# Patient Record
Sex: Female | Born: 1980 | Hispanic: No | Marital: Married | Smoking: Never smoker
Health system: Southern US, Community
[De-identification: ages and names within clinical notes are randomized; demographics above are authoritative.]

## PROBLEM LIST (undated history)

## (undated) DIAGNOSIS — O09529 Supervision of elderly multigravida, unspecified trimester: Secondary | ICD-10-CM

## (undated) DIAGNOSIS — E119 Type 2 diabetes mellitus without complications: Secondary | ICD-10-CM

## (undated) DIAGNOSIS — E039 Hypothyroidism, unspecified: Secondary | ICD-10-CM

## (undated) HISTORY — DX: Supervision of elderly multigravida, unspecified trimester: O09.529

## (undated) HISTORY — DX: Type 2 diabetes mellitus without complications: E11.9

## (undated) HISTORY — PX: NO PAST SURGERIES: SHX2092

## (undated) HISTORY — DX: Hypothyroidism, unspecified: E03.9

---

## 2014-07-08 NOTE — L&D Delivery Note (Signed)
Delivery Note Patient was admitted in active labor. She labor down. AROM at 9AM.  At  At 9:21 AM a viable female was delivered via Vaginal, Spontaneous Delivery (Presentation: Right Occiput Anterior).  APGAR: 8, 9; weight 7 lb 12.7 oz (3535 g).  Patient has postpartum bleeding with retained productions of conception. Manual extraction of placenta and blood clot was performed and bleeding remitted. Patient blood loss was significant for postpartum hemorrhage.  Placenta status: Intact, Spontaneous.  Cord: 3 vessels with the following complications: None.  Cord pH: none collected   Anesthesia: Epidural  Episiotomy: None Lacerations: 2nd degree Suture Repair: 3.0 vicryl Est. Blood Loss (mL): 1200  Mom to postpartum.  Baby to Couplet care / Skin to Skin.  Octavia Velador Z Shai Rasmussen 06/05/2015, 11:15 AM

## 2014-11-03 LAB — OB RESULTS CONSOLE RPR: RPR: NONREACTIVE

## 2014-11-03 LAB — OB RESULTS CONSOLE ABO/RH: RH Type: POSITIVE

## 2014-11-03 LAB — OB RESULTS CONSOLE RUBELLA ANTIBODY, IGM: Rubella: IMMUNE

## 2014-11-03 LAB — OB RESULTS CONSOLE HGB/HCT, BLOOD
HCT: 37 %
Hemoglobin: 12.1 g/dL

## 2014-11-03 LAB — OB RESULTS CONSOLE PLATELET COUNT: PLATELETS: 227 10*3/uL

## 2015-05-01 LAB — OB RESULTS CONSOLE GBS: STREP GROUP B AG: NEGATIVE

## 2015-05-10 ENCOUNTER — Encounter: Payer: Self-pay | Admitting: *Deleted

## 2015-05-10 DIAGNOSIS — O3110X0 Continuing pregnancy after spontaneous abortion of one fetus or more, unspecified trimester, not applicable or unspecified: Secondary | ICD-10-CM

## 2015-05-10 DIAGNOSIS — O0990 Supervision of high risk pregnancy, unspecified, unspecified trimester: Secondary | ICD-10-CM | POA: Insufficient documentation

## 2015-05-10 DIAGNOSIS — E559 Vitamin D deficiency, unspecified: Secondary | ICD-10-CM

## 2015-05-22 ENCOUNTER — Encounter: Payer: Self-pay | Admitting: Obstetrics and Gynecology

## 2015-05-22 ENCOUNTER — Ambulatory Visit (INDEPENDENT_AMBULATORY_CARE_PROVIDER_SITE_OTHER): Payer: Self-pay | Admitting: Obstetrics and Gynecology

## 2015-05-22 VITALS — BP 111/59 | HR 86 | Wt 226.9 lb

## 2015-05-22 DIAGNOSIS — Z113 Encounter for screening for infections with a predominantly sexual mode of transmission: Secondary | ICD-10-CM

## 2015-05-22 DIAGNOSIS — O0940 Supervision of pregnancy with grand multiparity, unspecified trimester: Secondary | ICD-10-CM | POA: Insufficient documentation

## 2015-05-22 DIAGNOSIS — O0943 Supervision of pregnancy with grand multiparity, third trimester: Secondary | ICD-10-CM

## 2015-05-22 DIAGNOSIS — O0993 Supervision of high risk pregnancy, unspecified, third trimester: Secondary | ICD-10-CM

## 2015-05-22 DIAGNOSIS — O09529 Supervision of elderly multigravida, unspecified trimester: Secondary | ICD-10-CM | POA: Insufficient documentation

## 2015-05-22 DIAGNOSIS — O09523 Supervision of elderly multigravida, third trimester: Secondary | ICD-10-CM

## 2015-05-22 LAB — POCT URINALYSIS DIP (DEVICE)
BILIRUBIN URINE: NEGATIVE
Glucose, UA: NEGATIVE mg/dL
HGB URINE DIPSTICK: NEGATIVE
KETONES UR: NEGATIVE mg/dL
LEUKOCYTES UA: NEGATIVE
Nitrite: NEGATIVE
Protein, ur: NEGATIVE mg/dL
SPECIFIC GRAVITY, URINE: 1.025 (ref 1.005–1.030)
Urobilinogen, UA: 0.2 mg/dL (ref 0.0–1.0)
pH: 6 (ref 5.0–8.0)

## 2015-05-22 LAB — CBC
HCT: 38.9 % (ref 36.0–46.0)
Hemoglobin: 12.9 g/dL (ref 12.0–15.0)
MCH: 28.1 pg (ref 26.0–34.0)
MCHC: 33.2 g/dL (ref 30.0–36.0)
MCV: 84.7 fL (ref 78.0–100.0)
MPV: 11.1 fL (ref 8.6–12.4)
PLATELETS: 214 10*3/uL (ref 150–400)
RBC: 4.59 MIL/uL (ref 3.87–5.11)
RDW: 16.7 % — ABNORMAL HIGH (ref 11.5–15.5)
WBC: 7.2 10*3/uL (ref 4.0–10.5)

## 2015-05-22 LAB — RPR

## 2015-05-22 LAB — HIV ANTIBODY (ROUTINE TESTING W REFLEX): HIV: NONREACTIVE

## 2015-05-22 NOTE — Patient Instructions (Signed)
Third Trimester of Pregnancy The third trimester is from week 29 through week 42, months 7 through 9. The third trimester is a time when the fetus is growing rapidly. At the end of the ninth month, the fetus is about 20 inches in length and weighs 6-10 pounds.  BODY CHANGES Your body goes through many changes during pregnancy. The changes vary from woman to woman.   Your weight will continue to increase. You can expect to gain 25-35 pounds (11-16 kg) by the end of the pregnancy.  You may begin to get stretch marks on your hips, abdomen, and breasts.  You may urinate more often because the fetus is moving lower into your pelvis and pressing on your bladder.  You may develop or continue to have heartburn as a result of your pregnancy.  You may develop constipation because certain hormones are causing the muscles that push waste through your intestines to slow down.  You may develop hemorrhoids or swollen, bulging veins (varicose veins).  You may have pelvic pain because of the weight gain and pregnancy hormones relaxing your joints between the bones in your pelvis. Backaches may result from overexertion of the muscles supporting your posture.  You may have changes in your hair. These can include thickening of your hair, rapid growth, and changes in texture. Some women also have hair loss during or after pregnancy, or hair that feels dry or thin. Your hair will most likely return to normal after your baby is born.  Your breasts will continue to grow and be tender. A yellow discharge may leak from your breasts called colostrum.  Your belly button may stick out.  You may feel short of breath because of your expanding uterus.  You may notice the fetus "dropping," or moving lower in your abdomen.  You may have a bloody mucus discharge. This usually occurs a few days to a week before labor begins.  Your cervix becomes thin and soft (effaced) near your due date. WHAT TO EXPECT AT YOUR  PRENATAL EXAMS  You will have prenatal exams every 2 weeks until week 36. Then, you will have weekly prenatal exams. During a routine prenatal visit:  You will be weighed to make sure you and the fetus are growing normally.  Your blood pressure is taken.  Your abdomen will be measured to track your baby's growth.  The fetal heartbeat will be listened to.  Any test results from the previous visit will be discussed.  You may have a cervical check near your due date to see if you have effaced. At around 36 weeks, your caregiver will check your cervix. At the same time, your caregiver will also perform a test on the secretions of the vaginal tissue. This test is to determine if a type of bacteria, Group B streptococcus, is present. Your caregiver will explain this further. Your caregiver may ask you:  What your birth plan is.  How you are feeling.  If you are feeling the baby move.  If you have had any abnormal symptoms, such as leaking fluid, bleeding, severe headaches, or abdominal cramping.  If you are using any tobacco products, including cigarettes, chewing tobacco, and electronic cigarettes.  If you have any questions. Other tests or screenings that may be performed during your third trimester include:  Blood tests that check for low iron levels (anemia).  Fetal testing to check the health, activity level, and growth of the fetus. Testing is done if you have certain medical conditions or if   there are problems during the pregnancy.  HIV (human immunodeficiency virus) testing. If you are at high risk, you may be screened for HIV during your third trimester of pregnancy. FALSE LABOR You may feel small, irregular contractions that eventually go away. These are called Braxton Hicks contractions, or false labor. Contractions may last for hours, days, or even weeks before true labor sets in. If contractions come at regular intervals, intensify, or become painful, it is best to be seen  by your caregiver.  SIGNS OF LABOR   Menstrual-like cramps.  Contractions that are 5 minutes apart or less.  Contractions that start on the top of the uterus and spread down to the lower abdomen and back.  A sense of increased pelvic pressure or back pain.  A watery or bloody mucus discharge that comes from the vagina. If you have any of these signs before the 37th week of pregnancy, call your caregiver right away. You need to go to the hospital to get checked immediately. HOME CARE INSTRUCTIONS   Avoid all smoking, herbs, alcohol, and unprescribed drugs. These chemicals affect the formation and growth of the baby.  Do not use any tobacco products, including cigarettes, chewing tobacco, and electronic cigarettes. If you need help quitting, ask your health care provider. You may receive counseling support and other resources to help you quit.  Follow your caregiver's instructions regarding medicine use. There are medicines that are either safe or unsafe to take during pregnancy.  Exercise only as directed by your caregiver. Experiencing uterine cramps is a good sign to stop exercising.  Continue to eat regular, healthy meals.  Wear a good support bra for breast tenderness.  Do not use hot tubs, steam rooms, or saunas.  Wear your seat belt at all times when driving.  Avoid raw meat, uncooked cheese, cat litter boxes, and soil used by cats. These carry germs that can cause birth defects in the baby.  Take your prenatal vitamins.  Take 1500-2000 mg of calcium daily starting at the 20th week of pregnancy until you deliver your baby.  Try taking a stool softener (if your caregiver approves) if you develop constipation. Eat more high-fiber foods, such as fresh vegetables or fruit and whole grains. Drink plenty of fluids to keep your urine clear or pale yellow.  Take warm sitz baths to soothe any pain or discomfort caused by hemorrhoids. Use hemorrhoid cream if your caregiver  approves.  If you develop varicose veins, wear support hose. Elevate your feet for 15 minutes, 3-4 times a day. Limit salt in your diet.  Avoid heavy lifting, wear low heal shoes, and practice good posture.  Rest a lot with your legs elevated if you have leg cramps or low back pain.  Visit your dentist if you have not gone during your pregnancy. Use a soft toothbrush to brush your teeth and be gentle when you floss.  A sexual relationship may be continued unless your caregiver directs you otherwise.  Do not travel far distances unless it is absolutely necessary and only with the approval of your caregiver.  Take prenatal classes to understand, practice, and ask questions about the labor and delivery.  Make a trial run to the hospital.  Pack your hospital bag.  Prepare the baby's nursery.  Continue to go to all your prenatal visits as directed by your caregiver. SEEK MEDICAL CARE IF:  You are unsure if you are in labor or if your water has broken.  You have dizziness.  You have   mild pelvic cramps, pelvic pressure, or nagging pain in your abdominal area.  You have persistent nausea, vomiting, or diarrhea.  You have a bad smelling vaginal discharge.  You have pain with urination. SEEK IMMEDIATE MEDICAL CARE IF:   You have a fever.  You are leaking fluid from your vagina.  You have spotting or bleeding from your vagina.  You have severe abdominal cramping or pain.  You have rapid weight loss or gain.  You have shortness of breath with chest pain.  You notice sudden or extreme swelling of your face, hands, ankles, feet, or legs.  You have not felt your baby move in over an hour.  You have severe headaches that do not go away with medicine.  You have vision changes.   This information is not intended to replace advice given to you by your health care provider. Make sure you discuss any questions you have with your health care provider.   Document Released:  06/18/2001 Document Revised: 07/15/2014 Document Reviewed: 08/25/2012 Elsevier Interactive Patient Education 2016 [employer]  Contraception Choices Contraception (birth control) is the use of any methods or devices to prevent pregnancy. Below are some methods to help avoid pregnancy. HORMONAL METHODS   Contraceptive implant. This is a thin, plastic tube containing progesterone hormone. It does not contain estrogen hormone. Your health care provider inserts the tube in the inner part of the upper arm. The tube can remain in place for up to 3 years. After 3 years, the implant must be removed. The implant prevents the ovaries from releasing an egg (ovulation), thickens the cervical mucus to prevent sperm from entering the uterus, and thins the lining of the inside of the uterus.  Progesterone-only injections. These injections are given every 3 months by your health care provider to prevent pregnancy. This synthetic progesterone hormone stops the ovaries from releasing eggs. It also thickens cervical mucus and changes the uterine lining. This makes it harder for sperm to survive in the uterus.  Birth control pills. These pills contain estrogen and progesterone hormone. They work by preventing the ovaries from releasing eggs (ovulation). They also cause the cervical mucus to thicken, preventing the sperm from entering the uterus. Birth control pills are prescribed by a health care provider.Birth control pills can also be used to treat heavy periods.  Minipill. This type of birth control pill contains only the progesterone hormone. They are taken every day of each month and must be prescribed by your health care provider.  Birth control patch. The patch contains hormones similar to those in birth control pills. It must be changed once a week and is prescribed by a health care provider.  Vaginal ring. The ring contains hormones similar to those in birth control pills. It is left in the vagina for 3  weeks, removed for 1 week, and then a new one is put back in place. The patient must be comfortable inserting and removing the ring from the vagina.A health care provider's prescription is necessary.  Emergency contraception. Emergency contraceptives prevent pregnancy after unprotected sexual intercourse. This pill can be taken right after sex or up to 5 days after unprotected sex. It is most effective the sooner you take the pills after having sexual intercourse. Most emergency contraceptive pills are available without a prescription. Check with your pharmacist. Do not use emergency contraception as your only form of birth control. BARRIER METHODS   Female condom. This is a thin sheath (latex or rubber) that is worn over the penis   during sexual intercourse. It can be used with spermicide to increase effectiveness.  Female condom. This is a soft, loose-fitting sheath that is put into the vagina before sexual intercourse.  Diaphragm. This is a soft, latex, dome-shaped barrier that must be fitted by a health care provider. It is inserted into the vagina, along with a spermicidal jelly. It is inserted before intercourse. The diaphragm should be left in the vagina for 6 to 8 hours after intercourse.  Cervical cap. This is a round, soft, latex or plastic cup that fits over the cervix and must be fitted by a health care provider. The cap can be left in place for up to 48 hours after intercourse.  Sponge. This is a soft, circular piece of polyurethane foam. The sponge has spermicide in it. It is inserted into the vagina after wetting it and before sexual intercourse.  Spermicides. These are chemicals that kill or block sperm from entering the cervix and uterus. They come in the form of creams, jellies, suppositories, foam, or tablets. They do not require a prescription. They are inserted into the vagina with an applicator before having sexual intercourse. The process must be repeated every time you have  sexual intercourse. INTRAUTERINE CONTRACEPTION  Intrauterine device (IUD). This is a T-shaped device that is put in a woman's uterus during a menstrual period to prevent pregnancy. There are 2 types:  Copper IUD. This type of IUD is wrapped in copper wire and is placed inside the uterus. Copper makes the uterus and fallopian tubes produce a fluid that kills sperm. It can stay in place for 10 years.  Hormone IUD. This type of IUD contains the hormone progestin (synthetic progesterone). The hormone thickens the cervical mucus and prevents sperm from entering the uterus, and it also thins the uterine lining to prevent implantation of a fertilized egg. The hormone can weaken or kill the sperm that get into the uterus. It can stay in place for 3-5 years, depending on which type of IUD is used. PERMANENT METHODS OF CONTRACEPTION  Female tubal ligation. This is when the woman's fallopian tubes are surgically sealed, tied, or blocked to prevent the egg from traveling to the uterus.  Hysteroscopic sterilization. This involves placing a small coil or insert into each fallopian tube. Your doctor uses a technique called hysteroscopy to do the procedure. The device causes scar tissue to form. This results in permanent blockage of the fallopian tubes, so the sperm cannot fertilize the egg. It takes about 3 months after the procedure for the tubes to become blocked. You must use another form of birth control for these 3 months.  Female sterilization. This is when the female has the tubes that carry sperm tied off (vasectomy).This blocks sperm from entering the vagina during sexual intercourse. After the procedure, the man can still ejaculate fluid (semen). NATURAL PLANNING METHODS  Natural family planning. This is not having sexual intercourse or using a barrier method (condom, diaphragm, cervical cap) on days the woman could become pregnant.  Calendar method. This is keeping track of the length of each menstrual  cycle and identifying when you are fertile.  Ovulation method. This is avoiding sexual intercourse during ovulation.  Symptothermal method. This is avoiding sexual intercourse during ovulation, using a thermometer and ovulation symptoms.  Post-ovulation method. This is timing sexual intercourse after you have ovulated. Regardless of which type or method of contraception you choose, it is important that you use condoms to protect against the transmission of sexually transmitted infections (  STIs). Talk with your health care provider about which form of contraception is most appropriate for you.   This information is not intended to replace advice given to you by your health care provider. Make sure you discuss any questions you have with your health care provider.   Document Released: 06/24/2005 Document Revised: 06/29/2013 Document Reviewed: 12/17/2012 Elsevier Interactive Patient Education 2016 [employer]   Breastfeeding Deciding to breastfeed is one of the best choices you can make for you and your baby. A change in hormones during pregnancy causes your breast tissue to grow and increases the number and size of your milk ducts. These hormones also allow proteins, sugars, and fats from your blood supply to make breast milk in your milk-producing glands. Hormones prevent breast milk from being released before your baby is born as well as prompt milk flow after birth. Once breastfeeding has begun, thoughts of your baby, as well as his or her sucking or crying, can stimulate the release of milk from your milk-producing glands.  BENEFITS OF BREASTFEEDING For Your Baby  Your first milk (colostrum) helps your baby's digestive system function better.  There are antibodies in your milk that help your baby fight off infections.  Your baby has a lower incidence of asthma, allergies, and sudden infant death syndrome.  The nutrients in breast milk are better for your baby than infant formulas and  are designed uniquely for your baby's needs.  Breast milk improves your baby's brain development.  Your baby is less likely to develop other conditions, such as childhood obesity, asthma, or type 2 diabetes mellitus. For You  Breastfeeding helps to create a very special bond between you and your baby.  Breastfeeding is convenient. Breast milk is always available at the correct temperature and costs nothing.  Breastfeeding helps to burn calories and helps you lose the weight gained during pregnancy.  Breastfeeding makes your uterus contract to its prepregnancy size faster and slows bleeding (lochia) after you give birth.   Breastfeeding helps to lower your risk of developing type 2 diabetes mellitus, osteoporosis, and breast or ovarian cancer later in life. SIGNS THAT YOUR BABY IS HUNGRY Early Signs of Hunger  Increased alertness or activity.  Stretching.  Movement of the head from side to side.  Movement of the head and opening of the mouth when the corner of the mouth or cheek is stroked (rooting).  Increased sucking sounds, smacking lips, cooing, sighing, or squeaking.  Hand-to-mouth movements.  Increased sucking of fingers or hands. Late Signs of Hunger  Fussing.  Intermittent crying. Extreme Signs of Hunger Signs of extreme hunger will require calming and consoling before your baby will be able to breastfeed successfully. Do not wait for the following signs of extreme hunger to occur before you initiate breastfeeding:  Restlessness.  A loud, strong cry.  Screaming. BREASTFEEDING BASICS Breastfeeding Initiation  Find a comfortable place to sit or lie down, with your neck and back well supported.  Place a pillow or rolled up blanket under your baby to bring him or her to the level of your breast (if you are seated). Nursing pillows are specially designed to help support your arms and your baby while you breastfeed.  Make sure that your baby's abdomen is facing  your abdomen.  Gently massage your breast. With your fingertips, massage from your chest wall toward your nipple in a circular motion. This encourages milk flow. You may need to continue this action during the feeding if your milk flows slowly.    Support your breast with 4 fingers underneath and your thumb above your nipple. Make sure your fingers are well away from your nipple and your baby's mouth.  Stroke your baby's lips gently with your finger or nipple.  When your baby's mouth is open wide enough, quickly bring your baby to your breast, placing your entire nipple and as much of the colored area around your nipple (areola) as possible into your baby's mouth.  More areola should be visible above your baby's upper lip than below the lower lip.  Your baby's tongue should be between his or her lower gum and your breast.  Ensure that your baby's mouth is correctly positioned around your nipple (latched). Your baby's lips should create a seal on your breast and be turned out (everted).  It is common for your baby to suck about 2-3 minutes in order to start the flow of breast milk. Latching Teaching your baby how to latch on to your breast properly is very important. An improper latch can cause nipple pain and decreased milk supply for you and poor weight gain in your baby. Also, if your baby is not latched onto your nipple properly, he or she may swallow some air during feeding. This can make your baby fussy. Burping your baby when you switch breasts during the feeding can help to get rid of the air. However, teaching your baby to latch on properly is still the best way to prevent fussiness from swallowing air while breastfeeding. Signs that your baby has successfully latched on to your nipple:  Silent tugging or silent sucking, without causing you pain.  Swallowing heard between every 3-4 sucks.  Muscle movement above and in front of his or her ears while sucking. Signs that your baby has  not successfully latched on to nipple:  Sucking sounds or smacking sounds from your baby while breastfeeding.  Nipple pain. If you think your baby has not latched on correctly, slip your finger into the corner of your baby's mouth to break the suction and place it between your baby's gums. Attempt breastfeeding initiation again. Signs of Successful Breastfeeding Signs from your baby:  A gradual decrease in the number of sucks or complete cessation of sucking.  Falling asleep.  Relaxation of his or her body.  Retention of a small amount of milk in his or her mouth.  Letting go of your breast by himself or herself. Signs from you:  Breasts that have increased in firmness, weight, and size 1-3 hours after feeding.  Breasts that are softer immediately after breastfeeding.  Increased milk volume, as well as a change in milk consistency and color by the fifth day of breastfeeding.  Nipples that are not sore, cracked, or bleeding. Signs That Your Baby is Getting Enough Milk  Wetting at least 3 diapers in a 24-hour period. The urine should be clear and pale yellow by age 5 days.  At least 3 stools in a 24-hour period by age 5 days. The stool should be soft and yellow.  At least 3 stools in a 24-hour period by age 7 days. The stool should be seedy and yellow.  No loss of weight greater than 10% of birth weight during the first 3 days of age.  Average weight gain of 4-7 ounces (113-198 g) per week after age 4 days.  Consistent daily weight gain by age 5 days, without weight loss after the age of 2 weeks. After a feeding, your baby may spit up a small amount. This   is common. BREASTFEEDING FREQUENCY AND DURATION Frequent feeding will help you make more milk and can prevent sore nipples and breast engorgement. Breastfeed when you feel the need to reduce the fullness of your breasts or when your baby shows signs of hunger. This is called "breastfeeding on demand." Avoid introducing a  pacifier to your baby while you are working to establish breastfeeding (the first 4-6 weeks after your baby is born). After this time you may choose to use a pacifier. Research has shown that pacifier use during the first year of a baby's life decreases the risk of sudden infant death syndrome (SIDS). Allow your baby to feed on each breast as long as he or she wants. Breastfeed until your baby is finished feeding. When your baby unlatches or falls asleep while feeding from the first breast, offer the second breast. Because newborns are often sleepy in the first few weeks of life, you may need to awaken your baby to get him or her to feed. Breastfeeding times will vary from baby to baby. However, the following rules can serve as a guide to help you ensure that your baby is properly fed:  Newborns (babies 4 weeks of age or younger) may breastfeed every 1-3 hours.  Newborns should not go longer than 3 hours during the day or 5 hours during the night without breastfeeding.  You should breastfeed your baby a minimum of 8 times in a 24-hour period until you begin to introduce solid foods to your baby at around 6 months of age. BREAST MILK PUMPING Pumping and storing breast milk allows you to ensure that your baby is exclusively fed your breast milk, even at times when you are unable to breastfeed. This is especially important if you are going back to work while you are still breastfeeding or when you are not able to be present during feedings. Your lactation consultant can give you guidelines on how long it is safe to store breast milk. A breast pump is a machine that allows you to pump milk from your breast into a sterile bottle. The pumped breast milk can then be stored in a refrigerator or freezer. Some breast pumps are operated by hand, while others use electricity. Ask your lactation consultant which type will work best for you. Breast pumps can be purchased, but some hospitals and breastfeeding support  groups lease breast pumps on a monthly basis. A lactation consultant can teach you how to hand express breast milk, if you prefer not to use a pump. CARING FOR YOUR BREASTS WHILE YOU BREASTFEED Nipples can become dry, cracked, and sore while breastfeeding. The following recommendations can help keep your breasts moisturized and healthy:  Avoid using soap on your nipples.  Wear a supportive bra. Although not required, special nursing bras and tank tops are designed to allow access to your breasts for breastfeeding without taking off your entire bra or top. Avoid wearing underwire-style bras or extremely tight bras.  Air dry your nipples for 3-4minutes after each feeding.  Use only cotton bra pads to absorb leaked breast milk. Leaking of breast milk between feedings is normal.  Use lanolin on your nipples after breastfeeding. Lanolin helps to maintain your skin's normal moisture barrier. If you use pure lanolin, you do not need to wash it off before feeding your baby again. Pure lanolin is not toxic to your baby. You may also hand express a few drops of breast milk and gently massage that milk into your nipples and allow the milk   to air dry. In the first few weeks after giving birth, some women experience extremely full breasts (engorgement). Engorgement can make your breasts feel heavy, warm, and tender to the touch. Engorgement peaks within 3-5 days after you give birth. The following recommendations can help ease engorgement:  Completely empty your breasts while breastfeeding or pumping. You may want to start by applying warm, moist heat (in the shower or with warm water-soaked hand towels) just before feeding or pumping. This increases circulation and helps the milk flow. If your baby does not completely empty your breasts while breastfeeding, pump any extra milk after he or she is finished.  Wear a snug bra (nursing or regular) or tank top for 1-2 days to signal your body to slightly decrease  milk production.  Apply ice packs to your breasts, unless this is too uncomfortable for you.  Make sure that your baby is latched on and positioned properly while breastfeeding. If engorgement persists after 48 hours of following these recommendations, contact your health care provider or a lactation consultant. OVERALL HEALTH CARE RECOMMENDATIONS WHILE BREASTFEEDING  Eat healthy foods. Alternate between meals and snacks, eating 3 of each per day. Because what you eat affects your breast milk, some of the foods may make your baby more irritable than usual. Avoid eating these foods if you are sure that they are negatively affecting your baby.  Drink milk, fruit juice, and water to satisfy your thirst (about 10 glasses a day).  Rest often, relax, and continue to take your prenatal vitamins to prevent fatigue, stress, and anemia.  Continue breast self-awareness checks.  Avoid chewing and smoking tobacco. Chemicals from cigarettes that pass into breast milk and exposure to secondhand smoke may harm your baby.  Avoid alcohol and drug use, including marijuana. Some medicines that may be harmful to your baby can pass through breast milk. It is important to ask your health care provider before taking any medicine, including all over-the-counter and prescription medicine as well as vitamin and herbal supplements. It is possible to become pregnant while breastfeeding. If birth control is desired, ask your health care provider about options that will be safe for your baby. SEEK MEDICAL CARE IF:  You feel like you want to stop breastfeeding or have become frustrated with breastfeeding.  You have painful breasts or nipples.  Your nipples are cracked or bleeding.  Your breasts are red, tender, or warm.  You have a swollen area on either breast.  You have a fever or chills.  You have nausea or vomiting.  You have drainage other than breast milk from your nipples.  Your breasts do not become  full before feedings by the fifth day after you give birth.  You feel sad and depressed.  Your baby is too sleepy to eat well.  Your baby is having trouble sleeping.   Your baby is wetting less than 3 diapers in a 24-hour period.  Your baby has less than 3 stools in a 24-hour period.  Your baby's skin or the white part of his or her eyes becomes yellow.   Your baby is not gaining weight by 5 days of age. SEEK IMMEDIATE MEDICAL CARE IF:  Your baby is overly tired (lethargic) and does not want to wake up and feed.  Your baby develops an unexplained fever.   This information is not intended to replace advice given to you by your health care provider. Make sure you discuss any questions you have with your health care provider.     Document Released: 06/24/2005 Document Revised: 03/15/2015 Document Reviewed: 12/16/2012 Elsevier Interactive Patient Education 2016 Elsevier Inc.  

## 2015-05-22 NOTE — Progress Notes (Signed)
   Subjective:    Amanda Dunlap is a [id number] 3613w6d being seen today for her first obstetrical visit.  Her obstetrical history is significant for advanced maternal age and grand multiparity. Patient does intend to breast feed. Pregnancy history fully reviewed.  Patient reports no complaints.  Filed Vitals:   05/22/15 0909  BP: 111/59  Pulse: 86  Weight: 226 lb 14.4 oz (102.921 kg)    HISTORY: OB History  Gravida Para Term Preterm AB SAB TAB Ectopic Multiple Living  6 5 5       5     # Outcome Date GA Lbr Len/2nd Weight Sex Delivery Anes PTL Lv  6 Current           5 Term      Vag-Spont     4 Term      Vag-Spont     3 Term      Vag-Spont     2 Term      Vag-Spont     1 Term      Vag-Spont        Past Medical History  Diagnosis Date  . Hypothyroidism    History reviewed. No pertinent past surgical history. Family History  Problem Relation Age of Onset  . Hypertension Mother   . Hypertension Father      Exam    Uterus:     Pelvic Exam:    Perineum: Normal Perineum   Vulva: normal   Vagina:  normal mucosa, normal discharge   pH:    Cervix: multiparous appearance   Adnexa: not evaluated   Bony Pelvis: gynecoid  System: Breast:  normal appearance, no masses or tenderness   Skin: normal coloration and turgor, no rashes    Neurologic: oriented, no focal deficits   Extremities: normal strength, tone, and muscle mass   HEENT extra ocular movement intact   Mouth/Teeth mucous membranes moist, pharynx normal without lesions, normal dentition   Neck supple and no masses   Cardiovascular: regular rate and rhythm   Respiratory:  chest clear, no wheezing, crepitations, rhonchi, normal symmetric air entry   Abdomen: soft, non-tender; bowel sounds normal; no masses,  no organomegaly, gravid   Urinary:       Assessment:    Pregnancy: [id number] Patient Active Problem List   Diagnosis Date Noted  . Grand multiparity with current pregnancy, antepartum 05/22/2015  .  Supervision of high-risk pregnancy 05/10/2015  . Vitamin D deficiency 05/10/2015  . 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 Prenatal vitamins. Problem list reviewed and updated. Genetic Screening discussed : too late.  Ultrasound discussed; fetal survey: results reviewed.  Follow up in 1 weeks. 50% of 30 min visit spent on counseling and coordination of care.     Amanda Dunlap 05/22/2015

## 2015-05-23 LAB — PRESCRIPTION MONITORING PROFILE (19 PANEL)
AMPHETAMINE/METH: NEGATIVE ng/mL
Barbiturate Screen, Urine: NEGATIVE ng/mL
Benzodiazepine Screen, Urine: NEGATIVE ng/mL
Buprenorphine, Urine: NEGATIVE ng/mL
CANNABINOID SCRN UR: NEGATIVE ng/mL
CARISOPRODOL, URINE: NEGATIVE ng/mL
COCAINE METABOLITES: NEGATIVE ng/mL
Creatinine, Urine: 139.59 mg/dL (ref >20.0)
Fentanyl, Ur: NEGATIVE ng/mL
MDMA URINE: NEGATIVE ng/mL
MEPERIDINE UR: NEGATIVE ng/mL
METHADONE SCREEN, URINE: NEGATIVE ng/mL
Methaqualone: NEGATIVE ng/mL
Nitrites, Initial: NEGATIVE ug/mL
OPIATE SCREEN, URINE: NEGATIVE ng/mL
OXYCODONE SCRN UR: NEGATIVE ng/mL
PHENCYCLIDINE, UR: NEGATIVE ng/mL
Propoxyphene: NEGATIVE ng/mL
TAPENTADOLUR: NEGATIVE ng/mL
TRAMADOL UR: NEGATIVE ng/mL
Zolpidem, Urine: NEGATIVE ng/mL
pH, Initial: 5.8 pH (ref 4.5–8.9)

## 2015-05-23 LAB — GC/CHLAMYDIA PROBE AMP (~~LOC~~) NOT AT ARMC
Chlamydia: NEGATIVE
NEISSERIA GONORRHEA: NEGATIVE

## 2015-05-24 LAB — CULTURE, OB URINE
COLONY COUNT: NO GROWTH
Organism ID, Bacteria: NO GROWTH

## 2015-05-30 ENCOUNTER — Encounter: Payer: Self-pay | Admitting: Advanced Practice Midwife

## 2015-05-31 ENCOUNTER — Encounter: Payer: Self-pay | Admitting: Advanced Practice Midwife

## 2015-05-31 ENCOUNTER — Ambulatory Visit (INDEPENDENT_AMBULATORY_CARE_PROVIDER_SITE_OTHER): Payer: Medicaid Other | Admitting: Advanced Practice Midwife

## 2015-05-31 VITALS — BP 117/72 | HR 79 | Temp 98.4°F | Ht 65.0 in | Wt 228.2 lb

## 2015-05-31 DIAGNOSIS — O0943 Supervision of pregnancy with grand multiparity, third trimester: Secondary | ICD-10-CM

## 2015-05-31 DIAGNOSIS — Z23 Encounter for immunization: Secondary | ICD-10-CM

## 2015-05-31 DIAGNOSIS — O0993 Supervision of high risk pregnancy, unspecified, third trimester: Secondary | ICD-10-CM

## 2015-05-31 DIAGNOSIS — O09523 Supervision of elderly multigravida, third trimester: Secondary | ICD-10-CM

## 2015-05-31 LAB — POCT URINALYSIS DIP (DEVICE)
BILIRUBIN URINE: NEGATIVE
GLUCOSE, UA: NEGATIVE mg/dL
Hgb urine dipstick: NEGATIVE
Ketones, ur: NEGATIVE mg/dL
NITRITE: NEGATIVE
Protein, ur: NEGATIVE mg/dL
Specific Gravity, Urine: 1.02 (ref 1.005–1.030)
UROBILINOGEN UA: 0.2 mg/dL (ref 0.0–1.0)
pH: 6 (ref 5.0–8.0)

## 2015-05-31 MED ORDER — TETANUS-DIPHTH-ACELL PERTUSSIS 5-2.5-18.5 LF-MCG/0.5 IM SUSP
0.5000 mL | Freq: Once | INTRAMUSCULAR | Status: AC
  Administered 2015-05-31: 0.5 mL via INTRAMUSCULAR

## 2015-05-31 NOTE — Progress Notes (Signed)
Subjective:  Amanda Dunlap is a 34 y.o. G6P5005 at 6266w1d being seen today for ongoing prenatal care.  She is currently monitored for the following issues for this low-risk pregnancy and has Supervision of high-risk pregnancy; Vitamin D deficiency; Vanishing twin syndrome; Grand multiparity with current pregnancy, antepartum; and AMA (advanced maternal age) multigravida 35+ on her problem list.   Records from previous practice reviewed. Normal growth US 04/20/15. 72%.   Patient reports occasional contractions.  Contractions: Irregular. Vag. Bleeding: None.  Movement: Present. Denies leaking of fluid.   The following portions of the patient's history were reviewed and updated as appropriate: allergies, current medications, past family history, past medical history, past social history, past surgical history and problem list. Problem list updated.  Objective:   Filed Vitals:   05/31/15 0941  BP: 117/72  Pulse: 79  Temp: 98.4 F (36.9 C)  Weight: 228 lb 3.2 oz (103.511 kg)    Fetal Status: Fetal Heart Rate (bpm): 140   Movement: Present     General:  Alert, oriented and cooperative. Patient is in no acute distress.  Skin: Skin is warm and dry. No rash noted.   Cardiovascular: Normal heart rate noted  Respiratory: Normal respiratory effort, no problems with respiration noted  Abdomen: Soft, gravid, appropriate for gestational age. Pain/Pressure: Present     Pelvic: Vag. Bleeding: None     Cervical exam performed 1/0/ballotable  Extremities: Normal range of motion.  Edema: Trace  Mental Status: Normal mood and affect. Normal behavior. Normal judgment and thought content.   Urinalysis: Urine Protein: Negative Urine Glucose: Negative  Assessment and Plan:  Pregnancy: G6P5005 at 6866w1d  1. Grand multiparity with current pregnancy, antepartum, third trimester  - Culture, beta strep (group b only) - Flu Vaccine QUAD 36+ mos IM; Standing - Tdap (BOOSTRIX) injection 0.5 mL; Inject 0.5 mLs  into the muscle once. - Flu Vaccine QUAD 36+ mos IM  2. Supervision of high-risk pregnancy, third trimester  - Culture, beta strep (group b only) - Flu Vaccine QUAD 36+ mos IM; Standing - Tdap (BOOSTRIX) injection 0.5 mL; Inject 0.5 mLs into the muscle once. - Flu Vaccine QUAD 36+ mos IM - Hepatitis B surface antigen - Antibody screen  3. Hx Vanishing twin.  -Had been told at previous practice that she would need growth US's. Normal growth US 04/20/15. Pt is also self pay and trying to limit cost. None needed now.    Term labor symptoms and general obstetric precautions including but not limited to vaginal bleeding, contractions, leaking of fluid and fetal movement were reviewed in detail with the patient. Please refer to After Visit Summary for other counseling recommendations.  Return in about 1 week (around 06/07/2015).   Dorathy KinsmanVirginia Julaine Zimny, CNM

## 2015-05-31 NOTE — Patient Instructions (Signed)
Tdap Vaccine (Tetanus, Diphtheria and Pertussis): What You Need to Know 1. Why get vaccinated? Tetanus, diphtheria and pertussis are very serious diseases. Tdap vaccine can protect us from these diseases. And, Tdap vaccine given to pregnant women can protect newborn babies against pertussis. TETANUS (Lockjaw) is rare in the [place] today. It causes painful muscle tightening and stiffness, usually all over the body.  It can lead to tightening of muscles in the head and neck so you can't open your mouth, swallow, or sometimes even breathe. Tetanus kills about 1 out of 10 people who are infected even after receiving the best medical care. DIPHTHERIA is also rare in the [place] today. It can cause a thick coating to form in the back of the throat.  It can lead to breathing problems, heart failure, paralysis, and death. PERTUSSIS (Whooping Cough) causes severe coughing spells, which can cause difficulty breathing, vomiting and disturbed sleep.  It can also lead to weight loss, incontinence, and rib fractures. Up to 2 in 100 adolescents and 5 in 100 adults with pertussis are hospitalized or have complications, which could include pneumonia or death. These diseases are caused by bacteria. Diphtheria and pertussis are spread from person to person through secretions from coughing or sneezing. Tetanus enters the body through cuts, scratches, or wounds. Before vaccines, as many as 200,000 cases of diphtheria, 200,000 cases of pertussis, and hundreds of cases of tetanus, were reported in the [place] each year. Since vaccination began, reports of cases for tetanus and diphtheria have dropped by about 99% and for pertussis by about 80%. 2. Tdap vaccine Tdap vaccine can protect adolescents and adults from tetanus, diphtheria, and pertussis. One dose of Tdap is routinely given at age 11 or 12. People who did not get Tdap at that age should get it as soon as possible. Tdap is especially important  for healthcare professionals and anyone having close contact with a baby younger than 12 months. Pregnant women should get a dose of Tdap during every pregnancy, to protect the newborn from pertussis. Infants are most at risk for severe, life-threatening complications from pertussis. Another vaccine, called Td, protects against tetanus and diphtheria, but not pertussis. A Td booster should be given every 10 years. Tdap may be given as one of these boosters if you have never gotten Tdap before. Tdap may also be given after a severe cut or burn to prevent tetanus infection. Your doctor or the person giving you the vaccine can give you more information. Tdap may safely be given at the same time as other vaccines. 3. Some people should not get this vaccine  A person who has ever had a life-threatening allergic reaction after a previous dose of any diphtheria, tetanus or pertussis containing vaccine, OR has a severe allergy to any part of this vaccine, should not get Tdap vaccine. Tell the person giving the vaccine about any severe allergies.  Anyone who had coma or long repeated seizures within 7 days after a childhood dose of DTP or DTaP, or a previous dose of Tdap, should not get Tdap, unless a cause other than the vaccine was found. They can still get Td.  Talk to your doctor if you:  have seizures or another nervous system problem,  had severe pain or swelling after any vaccine containing diphtheria, tetanus or pertussis,  ever had a condition called Guillain-Barr Syndrome (GBS),  aren't feeling well on the day the shot is scheduled. 4. Risks With any medicine, including vaccines, there is   a chance of side effects. These are usually mild and go away on their own. Serious reactions are also possible but are rare. Most people who get Tdap vaccine do not have any problems with it. Mild problems following Tdap (Did not interfere with activities)  Pain where the shot was given (about 3 in 4  adolescents or 2 in 3 adults)  Redness or swelling where the shot was given (about 1 person in 5)  Mild fever of at least 100.4F (up to about 1 in 25 adolescents or 1 in 100 adults)  Headache (about 3 or 4 people in 10)  Tiredness (about 1 person in 3 or 4)  Nausea, vomiting, diarrhea, stomach ache (up to 1 in 4 adolescents or 1 in 10 adults)  Chills, sore joints (about 1 person in 10)  Body aches (about 1 person in 3 or 4)  Rash, swollen glands (uncommon) Moderate problems following Tdap (Interfered with activities, but did not require medical attention)  Pain where the shot was given (up to 1 in 5 or 6)  Redness or swelling where the shot was given (up to about 1 in 16 adolescents or 1 in 12 adults)  Fever over 102F (about 1 in 100 adolescents or 1 in 250 adults)  Headache (about 1 in 7 adolescents or 1 in 10 adults)  Nausea, vomiting, diarrhea, stomach ache (up to 1 or 3 people in 100)  Swelling of the entire arm where the shot was given (up to about 1 in 500). Severe problems following Tdap (Unable to perform usual activities; required medical attention)  Swelling, severe pain, bleeding and redness in the arm where the shot was given (rare). Problems that could happen after any vaccine:  People sometimes faint after a medical procedure, including vaccination. Sitting or lying down for about 15 minutes can help prevent fainting, and injuries caused by a fall. Tell your doctor if you feel dizzy, or have vision changes or ringing in the ears.  Some people get severe pain in the shoulder and have difficulty moving the arm where a shot was given. This happens very rarely.  Any medication can cause a severe allergic reaction. Such reactions from a vaccine are very rare, estimated at fewer than 1 in a million doses, and would happen within a few minutes to a few hours after the vaccination. As with any medicine, there is a very remote chance of a vaccine causing a serious  injury or death. The safety of vaccines is always being monitored. For more information, visit: www.cdc.gov/vaccinesafety/ 5. What if there is a serious problem? What should I look for?  Look for anything that concerns you, such as signs of a severe allergic reaction, very high fever, or unusual behavior.  Signs of a severe allergic reaction can include hives, swelling of the face and throat, difficulty breathing, a fast heartbeat, dizziness, and weakness. These would usually start a few minutes to a few hours after the vaccination. What should I do?  If you think it is a severe allergic reaction or other emergency that can't wait, call 9-1-1 or get the person to the nearest hospital. Otherwise, call your doctor.  Afterward, the reaction should be reported to the Vaccine Adverse Event Reporting System (VAERS). Your doctor might file this report, or you can do it yourself through the VAERS web site at www.vaers.hhs.gov, or by calling 1-800-822-7967. VAERS does not give medical advice.  6. The National Vaccine Injury Compensation Program The National Vaccine Injury Compensation Program (  VICP) is a federal program that was created to compensate people who may have been injured by certain vaccines. Persons who believe they may have been injured by a vaccine can learn about the program and about filing a claim by calling 1-800-338-2382 or visiting the VICP website at www.hrsa.gov/vaccinecompensation. There is a time limit to file a claim for compensation. 7. How can I learn more?  Ask your doctor. He or she can give you the vaccine package insert or suggest other sources of information.  Call your local or state health department.  Contact the Centers for Disease Control and Prevention (CDC):  Call 1-800-232-4636 (1-800-CDC-INFO) or  Visit CDC's website at www.cdc.gov/vaccines CDC Tdap Vaccine VIS (08/31/13)   This information is not intended to replace advice given to you by your health care  provider. Make sure you discuss any questions you have with your health care provider.   Document Released: 12/24/2011 Document Revised: 07/15/2014 Document Reviewed: 10/06/2013 Elsevier Interactive Patient Education 2016 Elsevier Inc.  

## 2015-05-31 NOTE — Progress Notes (Signed)
Used interpreter Feryal Yousef.  

## 2015-06-01 LAB — HEPATITIS B SURFACE ANTIGEN: HEP B S AG: NEGATIVE

## 2015-06-01 LAB — ANTIBODY SCREEN: Antibody Screen: NEGATIVE

## 2015-06-02 LAB — CULTURE, BETA STREP (GROUP B ONLY)

## 2015-06-04 ENCOUNTER — Inpatient Hospital Stay (HOSPITAL_COMMUNITY)
Admission: AD | Admit: 2015-06-04 | Discharge: 2015-06-04 | Disposition: A | Discharge status: Home / Self Care | Payer: Self-pay | Source: Ambulatory Visit | Attending: Family Medicine | Admitting: Family Medicine

## 2015-06-04 ENCOUNTER — Encounter (HOSPITAL_COMMUNITY): Payer: Self-pay | Admitting: *Deleted

## 2015-06-04 DIAGNOSIS — Z3493 Encounter for supervision of normal pregnancy, unspecified, third trimester: Secondary | ICD-10-CM | POA: Insufficient documentation

## 2015-06-04 DIAGNOSIS — O3110X0 Continuing pregnancy after spontaneous abortion of one fetus or more, unspecified trimester, not applicable or unspecified: Secondary | ICD-10-CM

## 2015-06-04 DIAGNOSIS — E559 Vitamin D deficiency, unspecified: Secondary | ICD-10-CM

## 2015-06-04 NOTE — MAU Note (Signed)
Contractions, some bleeding.

## 2015-06-04 NOTE — MAU Note (Signed)
Pt states she has had spotting 2 times today, and contracting for 24 hours. +FM, denies LOF. Pain8/10.

## 2015-06-05 ENCOUNTER — Inpatient Hospital Stay (HOSPITAL_COMMUNITY): Payer: Medicaid Other | Admitting: Anesthesiology

## 2015-06-05 ENCOUNTER — Inpatient Hospital Stay (HOSPITAL_COMMUNITY)
Admission: AD | Admit: 2015-06-05 | Discharge: 2015-06-07 | DRG: 774 | Disposition: A | Discharge status: Home / Self Care | Payer: Medicaid Other | Source: Ambulatory Visit | Attending: Family Medicine | Admitting: Family Medicine

## 2015-06-05 ENCOUNTER — Encounter (HOSPITAL_COMMUNITY): Payer: Self-pay | Admitting: *Deleted

## 2015-06-05 DIAGNOSIS — O99284 Endocrine, nutritional and metabolic diseases complicating childbirth: Secondary | ICD-10-CM | POA: Diagnosis present

## 2015-06-05 DIAGNOSIS — E669 Obesity, unspecified: Secondary | ICD-10-CM | POA: Diagnosis present

## 2015-06-05 DIAGNOSIS — Z3A38 38 weeks gestation of pregnancy: Secondary | ICD-10-CM

## 2015-06-05 DIAGNOSIS — Z6838 Body mass index (BMI) 38.0-38.9, adult: Secondary | ICD-10-CM

## 2015-06-05 DIAGNOSIS — O3110X0 Continuing pregnancy after spontaneous abortion of one fetus or more, unspecified trimester, not applicable or unspecified: Secondary | ICD-10-CM | POA: Diagnosis present

## 2015-06-05 DIAGNOSIS — E559 Vitamin D deficiency, unspecified: Secondary | ICD-10-CM

## 2015-06-05 DIAGNOSIS — O09523 Supervision of elderly multigravida, third trimester: Secondary | ICD-10-CM

## 2015-06-05 DIAGNOSIS — O0943 Supervision of pregnancy with grand multiparity, third trimester: Secondary | ICD-10-CM

## 2015-06-05 DIAGNOSIS — O0940 Supervision of pregnancy with grand multiparity, unspecified trimester: Secondary | ICD-10-CM

## 2015-06-05 DIAGNOSIS — O09529 Supervision of elderly multigravida, unspecified trimester: Secondary | ICD-10-CM

## 2015-06-05 DIAGNOSIS — E039 Hypothyroidism, unspecified: Secondary | ICD-10-CM | POA: Diagnosis present

## 2015-06-05 DIAGNOSIS — O99214 Obesity complicating childbirth: Secondary | ICD-10-CM | POA: Diagnosis present

## 2015-06-05 DIAGNOSIS — O0993 Supervision of high risk pregnancy, unspecified, third trimester: Secondary | ICD-10-CM

## 2015-06-05 DIAGNOSIS — O0990 Supervision of high risk pregnancy, unspecified, unspecified trimester: Secondary | ICD-10-CM

## 2015-06-05 DIAGNOSIS — IMO0001 Reserved for inherently not codable concepts without codable children: Secondary | ICD-10-CM

## 2015-06-05 LAB — CBC
HCT: 39.8 % (ref 36.0–46.0)
HCT: 36.7 % (ref 36.0–46.0)
HCT: 35.2 % — ABNORMAL LOW (ref 36.0–46.0)
HEMOGLOBIN: 13.2 g/dL (ref 12.0–15.0)
HEMOGLOBIN: 12.3 g/dL (ref 12.0–15.0)
Hemoglobin: 11.6 g/dL — ABNORMAL LOW (ref 12.0–15.0)
MCH: 28.4 pg (ref 26.0–34.0)
MCH: 28.6 pg (ref 26.0–34.0)
MCH: 28.1 pg (ref 26.0–34.0)
MCHC: 33.2 g/dL (ref 30.0–36.0)
MCHC: 33.5 g/dL (ref 30.0–36.0)
MCHC: 33 g/dL (ref 30.0–36.0)
MCV: 85.6 fL (ref 78.0–100.0)
MCV: 85.3 fL (ref 78.0–100.0)
MCV: 85.2 fL (ref 78.0–100.0)
PLATELETS: 206 10*3/uL (ref 150–400)
PLATELETS: 197 10*3/uL (ref 150–400)
Platelets: 216 10*3/uL (ref 150–400)
RBC: 4.65 MIL/uL (ref 3.87–5.11)
RBC: 4.3 MIL/uL (ref 3.87–5.11)
RBC: 4.13 MIL/uL (ref 3.87–5.11)
RDW: 16.3 % — ABNORMAL HIGH (ref 11.5–15.5)
RDW: 16.1 % — ABNORMAL HIGH (ref 11.5–15.5)
RDW: 15.9 % — ABNORMAL HIGH (ref 11.5–15.5)
WBC: 8.2 10*3/uL (ref 4.0–10.5)
WBC: 10.8 10*3/uL — AB (ref 4.0–10.5)
WBC: 15.4 10*3/uL — ABNORMAL HIGH (ref 4.0–10.5)

## 2015-06-05 LAB — TYPE AND SCREEN
ABO/RH(D): O POS
ANTIBODY SCREEN: NEGATIVE

## 2015-06-05 LAB — PROTEIN / CREATININE RATIO, URINE
CREATININE, URINE: 175 mg/dL
PROTEIN CREATININE RATIO: 0.08 mg/mg{creat} (ref 0.00–0.15)
TOTAL PROTEIN, URINE: 14 mg/dL

## 2015-06-05 LAB — RPR: RPR: NONREACTIVE

## 2015-06-05 LAB — ABO/RH: ABO/RH(D): O POS

## 2015-06-05 MED ORDER — FENTANYL 2.5 MCG/ML BUPIVACAINE 1/10 % EPIDURAL INFUSION (WH - ANES)
14.0000 mL/h | INTRAMUSCULAR | Status: DC | PRN
  Administered 2015-06-05 (×2): 14 mL/h via EPIDURAL
  Filled 2015-06-05 (×2): qty 125

## 2015-06-05 MED ORDER — OXYTOCIN BOLUS FROM INFUSION: 500.0000 mL | INTRAVENOUS | Status: DC

## 2015-06-05 MED ORDER — DIBUCAINE 1 % RE OINT: 1.0000 "application " | TOPICAL_OINTMENT | RECTAL | Status: DC | PRN

## 2015-06-05 MED ORDER — LACTATED RINGERS IV SOLN
INTRAVENOUS | Status: DC
  Administered 2015-06-05 (×2): via INTRAVENOUS

## 2015-06-05 MED ORDER — ZOLPIDEM TARTRATE 5 MG PO TABS
5.0000 mg | ORAL_TABLET | Freq: Every evening | ORAL | Status: DC | PRN
Start: 2015-06-05 — End: 2015-06-07

## 2015-06-05 MED ORDER — EPHEDRINE 5 MG/ML INJ
10.0000 mg | INTRAVENOUS | Status: DC | PRN
Start: 2015-06-05 — End: 2015-06-05
  Filled 2015-06-05: qty 2

## 2015-06-05 MED ORDER — PHENYLEPHRINE 40 MCG/ML (10ML) SYRINGE FOR IV PUSH (FOR BLOOD PRESSURE SUPPORT)
80.0000 ug | PREFILLED_SYRINGE | INTRAVENOUS | Status: DC | PRN
Start: 2015-06-05 — End: 2015-06-05
  Filled 2015-06-05: qty 2
  Filled 2015-06-05: qty 20

## 2015-06-05 MED ORDER — ONDANSETRON HCL 4 MG/2ML IJ SOLN: 4.0000 mg | Freq: Four times a day (QID) | INTRAMUSCULAR | Status: DC | PRN

## 2015-06-05 MED ORDER — CITRIC ACID-SODIUM CITRATE 334-500 MG/5ML PO SOLN: 30.0000 mL | ORAL | Status: DC | PRN

## 2015-06-05 MED ORDER — OXYCODONE-ACETAMINOPHEN 5-325 MG PO TABS: 2.0000 | ORAL_TABLET | ORAL | Status: DC | PRN

## 2015-06-05 MED ORDER — LANOLIN HYDROUS EX OINT: TOPICAL_OINTMENT | CUTANEOUS | Status: DC | PRN

## 2015-06-05 MED ORDER — IBUPROFEN 600 MG PO TABS: 600.0000 mg | ORAL_TABLET | Freq: Four times a day (QID) | ORAL | Status: DC

## 2015-06-05 MED ORDER — ACETAMINOPHEN 325 MG PO TABS
650.0000 mg | ORAL_TABLET | ORAL | Status: DC | PRN
Start: 2015-06-05 — End: 2015-06-07
  Filled 2015-06-05: qty 2

## 2015-06-05 MED ORDER — DIPHENHYDRAMINE HCL 25 MG PO CAPS: 25.0000 mg | ORAL_CAPSULE | Freq: Four times a day (QID) | ORAL | Status: DC | PRN

## 2015-06-05 MED ORDER — FLEET ENEMA 7-19 GM/118ML RE ENEM: 1.0000 | ENEMA | RECTAL | Status: DC | PRN

## 2015-06-05 MED ORDER — WITCH HAZEL-GLYCERIN EX PADS: 1.0000 "application " | MEDICATED_PAD | CUTANEOUS | Status: DC | PRN

## 2015-06-05 MED ORDER — OXYCODONE-ACETAMINOPHEN 5-325 MG PO TABS
1.0000 | ORAL_TABLET | ORAL | Status: DC | PRN
Start: 2015-06-05 — End: 2015-06-05

## 2015-06-05 MED ORDER — ONDANSETRON HCL 4 MG PO TABS: 4.0000 mg | ORAL_TABLET | ORAL | Status: DC | PRN

## 2015-06-05 MED ORDER — PRENATAL MULTIVITAMIN CH: 1.0000 | ORAL_TABLET | Freq: Every day | ORAL | Status: DC

## 2015-06-05 MED ORDER — DIPHENHYDRAMINE HCL 50 MG/ML IJ SOLN: 12.5000 mg | INTRAMUSCULAR | Status: DC | PRN

## 2015-06-05 MED ORDER — LIDOCAINE HCL (PF) 1 % IJ SOLN
30.0000 mL | INTRAMUSCULAR | Status: DC | PRN
Start: 2015-06-05 — End: 2015-06-05
  Filled 2015-06-05: qty 30

## 2015-06-05 MED ORDER — SIMETHICONE 80 MG PO CHEW: 80.0000 mg | CHEWABLE_TABLET | ORAL | Status: DC | PRN

## 2015-06-05 MED ORDER — BENZOCAINE-MENTHOL 20-0.5 % EX AERO: 1.0000 "application " | INHALATION_SPRAY | CUTANEOUS | Status: DC | PRN

## 2015-06-05 MED ORDER — OXYCODONE-ACETAMINOPHEN 5-325 MG PO TABS
1.0000 | ORAL_TABLET | ORAL | Status: DC | PRN
  Filled 2015-06-05: qty 1

## 2015-06-05 MED ORDER — PRENATAL MULTIVITAMIN CH
1.0000 | ORAL_TABLET | Freq: Every day | ORAL | Status: DC
  Administered 2015-06-06 – 2015-06-07 (×2): 1 via ORAL
  Filled 2015-06-05 (×2): qty 1

## 2015-06-05 MED ORDER — SENNOSIDES-DOCUSATE SODIUM 8.6-50 MG PO TABS
2.0000 | ORAL_TABLET | ORAL | Status: DC
  Administered 2015-06-05 – 2015-06-06 (×2): 2 via ORAL
  Filled 2015-06-05 (×2): qty 2

## 2015-06-05 MED ORDER — ACETAMINOPHEN 325 MG PO TABS: 650.0000 mg | ORAL_TABLET | ORAL | Status: DC | PRN

## 2015-06-05 MED ORDER — SENNOSIDES-DOCUSATE SODIUM 8.6-50 MG PO TABS: 2.0000 | ORAL_TABLET | ORAL | Status: DC

## 2015-06-05 MED ORDER — ONDANSETRON HCL 4 MG/2ML IJ SOLN: 4.0000 mg | INTRAMUSCULAR | Status: DC | PRN

## 2015-06-05 MED ORDER — LACTATED RINGERS IV SOLN
500.0000 mL | INTRAVENOUS | Status: DC | PRN
  Administered 2015-06-05: 500 mL via INTRAVENOUS

## 2015-06-05 MED ORDER — TETANUS-DIPHTH-ACELL PERTUSSIS 5-2.5-18.5 LF-MCG/0.5 IM SUSP: 0.5000 mL | Freq: Once | INTRAMUSCULAR | Status: DC

## 2015-06-05 MED ORDER — OXYTOCIN 40 UNITS IN LACTATED RINGERS INFUSION - SIMPLE MED
62.5000 mL/h | INTRAVENOUS | Status: DC
  Administered 2015-06-05: 62.5 mL/h via INTRAVENOUS
  Filled 2015-06-05: qty 1000

## 2015-06-05 MED ORDER — LIDOCAINE HCL (PF) 1 % IJ SOLN
INTRAMUSCULAR | Status: DC | PRN
  Administered 2015-06-05 (×2): 4 mL

## 2015-06-05 MED ORDER — OXYCODONE-ACETAMINOPHEN 5-325 MG PO TABS: 1.0000 | ORAL_TABLET | ORAL | Status: DC | PRN

## 2015-06-05 MED ORDER — IBUPROFEN 600 MG PO TABS
600.0000 mg | ORAL_TABLET | Freq: Four times a day (QID) | ORAL | Status: DC
  Administered 2015-06-05 – 2015-06-07 (×8): 600 mg via ORAL
  Filled 2015-06-05 (×8): qty 1

## 2015-06-05 MED ORDER — ZOLPIDEM TARTRATE 5 MG PO TABS: 5.0000 mg | ORAL_TABLET | Freq: Every evening | ORAL | Status: DC | PRN

## 2015-06-05 MED ORDER — BENZOCAINE-MENTHOL 20-0.5 % EX AERO
1.0000 "application " | INHALATION_SPRAY | CUTANEOUS | Status: DC | PRN
  Administered 2015-06-05: 1 via TOPICAL
  Filled 2015-06-05: qty 56

## 2015-06-05 MED ORDER — WITCH HAZEL-GLYCERIN EX PADS
1.0000 "application " | MEDICATED_PAD | CUTANEOUS | Status: DC | PRN
  Administered 2015-06-05: 1 via TOPICAL

## 2015-06-05 NOTE — Lactation Note (Signed)
This note was copied from the chart of Amanda Dunlap. Lactation Consultation Note  Patient Name: Amanda Dunlap AVWUJ'WToday's Date: 06/05/2015 Reason for consult: Initial assessment    With this experienced breast feedign mom and term baby. This is mom's 6th baby. She is from [place]. She breast fed once, said the baby was still hungry, said she did not have enough milk in the first couple of days, so she supplement with formula. I assisted mom with latching the baby in football position, and he latched deeply and strong suckles. With hand expression I was not able to express any colostrum, and mom's breasts a re soft and flat at this time. I encouraged mom to feed on sue, 8-12 times a day, and to not to stop the baby from feeding, to allow him to end the feeding each time.Cluster feeding explained, and milk transitioning in about 2-3 days after birth.  Mom wants to supplement with formula, so I advised her to always breast feed the baby first, and then offer up to 10 ml's of formula to the baby. i also told her the bottle was good for one feeding only. Mom knows to call for questions/concerns.   Maternal Data Formula Feeding for Exclusion: Yes Reason for exclusion: Mother's choice to formula and breast feed on admission Has patient been taught Hand Expression?: Yes Does the patient have breastfeeding experience prior to this delivery?: Yes  Feeding Feeding Type: Breast Fed  LATCH Score/Interventions Latch: Grasps breast easily, tongue down, lips flanged, rhythmical sucking. (once baby placed in football hold) Intervention(s): Adjust position;Assist with latch  Audible Swallowing: None (no able to hand express any colostrum, breast flat and soft, mom supplemented with formula once laready) Intervention(s): Skin to skin;Hand expression  Type of Nipple: Everted at rest and after stimulation  Comfort (Breast/Nipple): Soft / non-tender     Hold (Positioning): Assistance needed to correctly  position infant at breast and maintain latch. Intervention(s): Breastfeeding basics reviewed;Support Pillows;Position options;Skin to skin  LATCH Score: 7  Lactation Tools Discussed/Used     Consult Status Consult Status: Follow-up Date: 06/06/15 Follow-up type: In-patient    Alfred LevinsLee, Amanda Dunlap 06/05/2015, 5:04 PM

## 2015-06-05 NOTE — Anesthesia Preprocedure Evaluation (Signed)
Anesthesia Evaluation  Patient identified by MRN, date of birth, ID band Patient awake    Reviewed: Allergy & Precautions, NPO status , Patient's Chart, lab work & pertinent test results  Airway Mallampati: II  TM Distance: >3 FB Neck ROM: Full    Dental no notable dental hx.    Pulmonary neg pulmonary ROS,    Pulmonary exam normal breath sounds clear to auscultation       Cardiovascular negative cardio ROS Normal cardiovascular exam Rhythm:Regular Rate:Normal     Neuro/Psych negative neurological ROS  negative psych ROS   GI/Hepatic negative GI ROS, Neg liver ROS,   Endo/Other  Hypothyroidism obesity  Renal/GU negative Renal ROS  negative genitourinary   Musculoskeletal negative musculoskeletal ROS (+)   Abdominal   Peds negative pediatric ROS (+)  Hematology negative hematology ROS (+)   Anesthesia Other Findings   Reproductive/Obstetrics (+) Pregnancy                             Anesthesia Physical Anesthesia Plan  ASA: II  Anesthesia Plan: Epidural   Post-op Pain Management:    Induction:   Airway Management Planned:   Additional Equipment:   Intra-op Plan:   Post-operative Plan:   Informed Consent: I have reviewed the patients History and Physical, chart, labs and discussed the procedure including the risks, benefits and alternatives for the proposed anesthesia with the patient or authorized representative who has indicated his/her understanding and acceptance.   Dental advisory given  Plan Discussed with: CRNA  Anesthesia Plan Comments:         Anesthesia Quick Evaluation

## 2015-06-05 NOTE — Anesthesia Procedure Notes (Signed)
Epidural Patient location during procedure: OB  Staffing Anesthesiologist: Tanieka Pownall Performed by: anesthesiologist   Preanesthetic Checklist Completed: patient identified, site marked, surgical consent, pre-op evaluation, timeout performed, IV checked, risks and benefits discussed and monitors and equipment checked  Epidural Patient position: sitting Prep: site prepped and draped and DuraPrep Patient monitoring: continuous pulse ox and blood pressure Approach: midline Location: L3-L4 Injection technique: LOR saline  Needle:  Needle type: Tuohy  Needle gauge: 17 G Needle length: 9 cm and 9 Needle insertion depth: 6 cm Catheter type: closed end flexible Catheter size: 19 Gauge Catheter at skin depth: 10 cm Test dose: negative  Assessment Events: blood not aspirated, injection not painful, no injection resistance, negative IV test and no paresthesia  Additional Notes Patient identified. Risks/Benefits/Options discussed with patient including but not limited to bleeding, infection, nerve damage, paralysis, failed block, incomplete pain control, headache, blood pressure changes, nausea, vomiting, reactions to medication both or allergic, itching and postpartum back pain. Confirmed with bedside nurse the patient's most recent platelet count. Confirmed with patient that they are not currently taking any anticoagulation, have any bleeding history or any family history of bleeding disorders. Patient expressed understanding and wished to proceed. All questions were answered. Sterile technique was used throughout the entire procedure. Please see nursing notes for vital signs. Test dose was given through epidural catheter and negative prior to continuing to dose epidural or start infusion. Warning signs of high block given to the patient including shortness of breath, tingling/numbness in hands, complete motor block, or any concerning symptoms with instructions to call for help. Patient was  given instructions on fall risk and not to get out of bed. All questions and concerns addressed with instructions to call with any issues or inadequate analgesia.   Interpreter used for consent and procedure

## 2015-06-05 NOTE — H&P (Signed)
Amanda Dunlap is a 34 y.o. female presenting for active labor. Maternal Medical History:  Reason for admission: Contractions.   Contractions: Onset was 6-12 hours ago.   Frequency: regular.   Perceived severity is moderate.    Fetal activity: Perceived fetal activity is normal.   Last perceived fetal movement was within the past hour.    Prenatal complications: no prenatal complications Prenatal Complications - Diabetes: none.    OB History    Gravida Para Term Preterm AB TAB SAB Ectopic Multiple Living   6 5 5       5      Past Medical History  Diagnosis Date  . Hypothyroidism    No past surgical history on file. Family History: family history includes Hypertension in her father and mother. Social History:  reports that she has never smoked. She does not have any smokeless tobacco history on file. She reports that she does not drink alcohol or use illicit drugs.   Prenatal Transfer Tool  Maternal Diabetes: No Genetic Screening: Normal Maternal Ultrasounds/Referrals: Normal Fetal Ultrasounds or other Referrals:  None Maternal Substance Abuse:  No Significant Maternal Medications:  None Significant Maternal Lab Results:  None Other Comments:  None  Review of Systems  Constitutional: Negative.   HENT: Negative.   Eyes: Negative.   Respiratory: Negative.   Cardiovascular: Negative.   Gastrointestinal: Positive for abdominal pain.  Genitourinary: Negative.   Musculoskeletal: Negative.   Skin: Negative.   Neurological: Negative.   Endo/Heme/Allergies: Negative.   Psychiatric/Behavioral: Negative.     Dilation: 7 Effacement (%): 90 Station: -2 Blood pressure 113/71, pulse 86, resp. rate 18, height 5\' 5"  (1.651 m), weight 229 lb (103.874 kg), last menstrual period 09/06/2014. Maternal Exam:  Uterine Assessment: Contraction strength is moderate.  Contraction frequency is regular.   Abdomen: Patient reports no abdominal tenderness. Fetal presentation:  vertex  Introitus: Normal vulva. Normal vagina.  Pelvis: adequate for delivery.   Cervix: Cervix evaluated by digital exam.     Fetal Exam Fetal Monitor Review: Mode: ultrasound.   Variability: moderate (6-25 bpm).   Pattern: accelerations present.    Fetal State Assessment: Category I - tracings are normal.     Physical Exam  Constitutional: She is oriented to person, place, and time. She appears well-developed and well-nourished.  HENT:  Head: Normocephalic.  Neck: Normal range of motion.  Cardiovascular: Normal rate, regular rhythm, normal heart sounds and intact distal pulses.   Respiratory: Effort normal and breath sounds normal.  GI: Soft. Bowel sounds are normal.  Genitourinary: Vagina normal and uterus normal.  Musculoskeletal: Normal range of motion.  Neurological: She is alert and oriented to person, place, and time. She has normal reflexes.  Skin: Skin is warm and dry.  Psychiatric: She has a normal mood and affect. Her behavior is normal. Judgment and thought content normal.    Prenatal labs: ABO, Rh: --/--/O POS (11/28 0300) Antibody: NEG (11/28 0300) Rubella: Immune (04/28 0000) RPR: NON REAC (11/14 0929)  HBsAg: NEGATIVE (11/23 1054)  HIV: NONREACTIVE (11/14 0929)  GBS: Negative (10/24 0000)   Assessment/Plan: 7/80/-2, active labor, will admit   LAWSON, MARIE DARLENE 06/05/2015, 4:30 AM

## 2015-06-06 ENCOUNTER — Encounter (HOSPITAL_COMMUNITY): Payer: Self-pay

## 2015-06-06 LAB — CBC
HCT: 28.6 % — ABNORMAL LOW (ref 36.0–46.0)
Hemoglobin: 9.5 g/dL — ABNORMAL LOW (ref 12.0–15.0)
MCH: 28.4 pg (ref 26.0–34.0)
MCHC: 33.2 g/dL (ref 30.0–36.0)
MCV: 85.6 fL (ref 78.0–100.0)
Platelets: 194 10*3/uL (ref 150–400)
RBC: 3.34 MIL/uL — ABNORMAL LOW (ref 3.87–5.11)
RDW: 16.1 % — ABNORMAL HIGH (ref 11.5–15.5)
WBC: 13 10*3/uL — ABNORMAL HIGH (ref 4.0–10.5)

## 2015-06-06 MED ORDER — FERROUS SULFATE 325 (65 FE) MG PO TABS
325.0000 mg | ORAL_TABLET | Freq: Every day | ORAL | Status: DC
  Administered 2015-06-06 – 2015-06-07 (×2): 325 mg via ORAL
  Filled 2015-06-06 (×2): qty 1

## 2015-06-06 NOTE — Progress Notes (Signed)
POSTPARTUM PROGRESS NOTE  Post Partum Day 1 Subjective:  Amanda Dunlap is a 34 y.o. G6P6001 4559w6d s/p nsvd c/b pph 1200 ml.  No acute events overnight.  Pt denies problems with ambulating, voiding or po intake.  She denies nausea or vomiting.  Pain is moderately controlled.  She has had flatus. She has not had bowel movement.  Lochia Small. Some lightheadeness. No palpitations or syncope.  Objective: Blood pressure 103/61, pulse 70, temperature 98 F (36.7 C), temperature source Oral, resp. rate 16, height 5\' 5"  (1.651 m), weight 229 lb (103.874 kg), last menstrual period 09/06/2014, SpO2 100 %, unknown if currently breastfeeding.  Physical Exam:  General: alert, cooperative and no distress Lochia:normal flow Chest: CTAB Heart: RRR no m/r/g Abdomen: +BS, soft, nontender,  Uterine Fundus: firm,  DVT Evaluation: No calf swelling or tenderness Extremities: trace edema   Recent Labs  06/05/15 1045 06/06/15 0556  HGB 11.6* 9.5*  HCT 35.2* 28.6*    Assessment/Plan:  ASSESSMENT: Amanda Dunlap is a 34 y.o. G6P6001 10559w6d s/p nsvd with pph 1200 ml. H drop appropriate as above. Lochia currently appropriate.  Plan for discharge tomorrow   LOS: 1 day   Silvano Bilisoah B Titianna Loomis 06/06/2015, 7:37 AM

## 2015-06-06 NOTE — Lactation Note (Signed)
This note was copied from the chart of Boy Mauricia AreaSara Haen. Lactation Consultation Note  Patient Name: Boy Mauricia AreaSara Vitiello ZOXWR'UToday's Date: 06/06/2015 Reason for consult: Follow-up assessment   With this mom of a term baby, now 4730 hours old. The baby has been cluster feeding, so mom thought since he was cryign so much, she needed to formula feed, so she fed him 10 ml's of formula. i reviewed with her cluster feeding, and showed her how her milk was beginning to transition in. Mom experienced breast feeder, and baby latched eagerly. Mom knows to call for questions/concerns. Baby with more than adequate voids and tolls now pasty brown.    Maternal Data    Feeding Feeding Type: Breast Fed  LATCH Score/Interventions    Audible Swallowing:  (easily expressed colostrum today)     Comfort (Breast/Nipple): Filling, red/small blisters or bruises, mild/mod discomfort  Problem noted: Filling        Lactation Tools Discussed/Used     Consult Status Consult Status: Follow-up Date: 06/07/15 Follow-up type: In-patient    Alfred LevinsLee, Airam Heidecker Anne 06/06/2015, 3:41 PM

## 2015-06-06 NOTE — Progress Notes (Signed)
Post Partum Day 1 Subjective:  Amanda Dunlap is a 34 y.o. [id number] 416w6d s/p SVD complicated by post-partum hemorrhage due to retained products.  No acute events overnight.  Pt reports problems with ambulating, voiding or po intake.  She denies nausea or vomiting.  Pain is well controlled.  She has had flatus.  Vaginal bleeding has improved but she reports dizziness this morning.  Plan for birth control is no method at this time.  Objective: Blood pressure 103/61, pulse 70, temperature 98 F (36.7 C), temperature source Oral, resp. rate 16, height 5\' 5"  (1.651 m), weight 103.874 kg (229 lb), last menstrual period 09/06/2014, SpO2 100 %, unknown if currently breastfeeding.  Physical Exam:  General: alert, cooperative and no distress Lochia:normal flow Chest: normal WOB Heart: Regular rate Abdomen: +BS, soft, mild TTP (appropriate) Uterine Fundus: firm DVT Evaluation: No evidence of DVT seen on physical exam. Extremities: no edema   Recent Labs  06/05/15 1045 06/06/15 0556  HGB 11.6* 9.5*  HCT 35.2* 28.6*    Assessment/Plan:  ASSESSMENT: Amanda Dunlap is a 34 y.o. [id number] 136w6d s/p SVD complicated by post-partum hemorrhage d/t retained products. She reports dizziness this morning and hasn't been out of bed much because of it. Hemoglobin down to 9.5 this morning from 11.6 yesterday. Will continue to monitor symptoms.  Plan for discharge tomorrow Continue routine PP care Breastfeeding support PRN  LOS: 1 day   Henrietta HooverWarren M Ahna Konkle 06/06/2015, 7:31 AM

## 2015-06-07 ENCOUNTER — Encounter (HOSPITAL_COMMUNITY): Payer: Self-pay | Admitting: Obstetrics & Gynecology

## 2015-06-07 MED ORDER — DOCUSATE SODIUM 100 MG PO CAPS: 100.0000 mg | ORAL_CAPSULE | Freq: Two times a day (BID) | ORAL | Status: DC

## 2015-06-07 MED ORDER — IBUPROFEN 600 MG PO TABS: 600.0000 mg | ORAL_TABLET | Freq: Four times a day (QID) | ORAL | Status: DC

## 2015-06-07 MED ORDER — FERROUS SULFATE 325 (65 FE) MG PO TABS: 325.0000 mg | ORAL_TABLET | Freq: Every day | ORAL | Status: DC

## 2015-06-07 NOTE — Lactation Note (Signed)
This note was copied from the chart of Amanda Dunlap Homeyer. Lactation Consultation Note Experienced BF mom has good everted nipples. Demonstrated hand expression has easy free flowing colostrum. Mom has been supplementing w/formula d/t cluster feeding,. Explained newborn behavior, cluster feeding, supply and demand. Reviewed engorgement prevention. Mom asked about nipple cream, encouraged to use breast milk, especially colostrum. Mom encouraged to do skin-to-skin. Patient Name: Amanda Dunlap Thaxton HYQMV'HToday's Date: 06/07/2015 Reason for consult: Follow-up assessment   Maternal Data    Feeding Feeding Type: Breast Fed Length of feed: 15 min  LATCH Score/Interventions Latch: Grasps breast easily, tongue down, lips flanged, rhythmical sucking. Intervention(s): Breast massage;Breast compression  Audible Swallowing: Spontaneous and intermittent Intervention(s): Hand expression  Type of Nipple: Everted at rest and after stimulation  Comfort (Breast/Nipple): Soft / non-tender  Problem noted: Mild/Moderate discomfort Interventions (Filling): Massage  Hold (Positioning): No assistance needed to correctly position infant at breast. Intervention(s): Position options;Skin to skin;Support Pillows  LATCH Score: 10  Lactation Tools Discussed/Used     Consult Status Consult Status: Complete Date: 06/07/15    Charyl DancerCARVER, Corion Sherrod G 06/07/2015, 6:58 AM

## 2015-06-07 NOTE — Anesthesia Postprocedure Evaluation (Signed)
Anesthesia Post Note  Patient: Amanda Dunlap  Procedure(s) Performed: * No procedures listed *  Patient location during evaluation: Mother Baby Anesthesia Type: Epidural Level of consciousness: awake and alert (English is not her first language) Pain management: satisfactory to patient Vital Signs Assessment: post-procedure vital signs reviewed and stable Respiratory status: spontaneous breathing Cardiovascular status: stable Postop Assessment: no headache and no backache Anesthetic complications: no    Last Vitals:  Filed Vitals:   06/06/15 1827 06/07/15 0536  BP: 109/60 110/70  Pulse: 76 93  Temp: 36.7 C 36.6 C  Resp: 18 18    Last Pain:  Filed Vitals:   06/07/15 0537  PainSc: 0-No pain                 Lancelot Alyea

## 2015-06-07 NOTE — Discharge Summary (Signed)
OB Discharge Summary     Patient Name: Amanda Dunlap DOB: 11/21/1980 MRN: [id number]  Date of admission: 06/05/2015 Delivering MD: Berton Bon   Date of discharge: 06/07/2015  Admitting diagnosis: 37 WEEKS DEEP PAIN Intrauterine pregnancy: [redacted]w[redacted]d     Secondary diagnosis:  Principal Problem:   Status post vaginal delivery Active Problems:   Supervision of high-risk pregnancy   Vanishing twin syndrome   Grand multiparity with current pregnancy, antepartum   AMA (advanced maternal age) multigravida 35+   Active labor   Retained amniotic membrane   PPH (postpartum hemorrhage)  Additional problems: none     Discharge diagnosis: Term Pregnancy Delivered and PPH                                                                                                Post partum procedures:none  Augmentation: AROM  Complications: Hemorrhage>1050mL, Manual extraction of placenta  Hospital course:  Onset of Labor With Vaginal Delivery     34 y.o. yo [id number] at [redacted]w[redacted]d was admitted in Latent Laboron 06/05/2015. Patient had an uncomplicated labor course as follows:  Membrane Rupture Time/Date: 9:10 AM ,06/05/2015   Intrapartum Procedures: Episiotomy: None [1]                                         Lacerations:  2nd degree [3]  Patient had a delivery of a Viable infant. 06/05/2015  Information for the patient's newborn:  Rori, Goar [[id number]]  Delivery Method: Vaginal, Spontaneous Delivery (Filed from Delivery Summary)   The patient required a manual extraction of placenta and had a PPH with EBl ~1215mL. Her post-partum hgb was 9.5 and she was started on iron. Patient had an uncomplicated postpartum course.  She is ambulating, tolerating a regular diet, passing flatus, and urinating well. Patient is discharged home in stable condition on 06/07/2015 .    Physical exam  Filed Vitals:   06/05/15 1945 06/06/15 0628 06/06/15 1827 06/07/15 0536  BP: 104/53 103/61 109/60 110/70   Pulse: 72 70 76 93  Temp: 97.8 F (36.6 C) 98 F (36.7 C) 98 F (36.7 C) 97.8 F (36.6 C)  TempSrc: Oral Oral Oral Oral  Resp: Height:      Weight:      SpO2:    100%   General: alert, cooperative and no distress Lochia: appropriate Uterine Fundus: firm Incision: N/A DVT Evaluation: No evidence of DVT seen on physical exam. Negative Homan's sign. Labs: Lab Results  Component Value Date   WBC 13.0* 06/06/2015   HGB 9.5* 06/06/2015   HCT 28.6* 06/06/2015   MCV 85.6 06/06/2015   PLT 194 06/06/2015   No flowsheet data found.  Discharge instruction: per After Visit Summary and "Baby and Me Booklet".  After visit meds:    Medication List    TAKE these medications        CALCIUM PO  Take 1 tablet by mouth daily. Unknown dose     docusate  sodium 100 MG capsule  Commonly known as:  COLACE  Take 1 capsule (100 mg total) by mouth 2 (two) times daily.     ferrous sulfate 325 (65 FE) MG tablet  Take 1 tablet (325 mg total) by mouth daily.     ibuprofen 600 MG tablet  Commonly known as:  ADVIL,MOTRIN  Take 1 tablet (600 mg total) by mouth every 6 (six) hours.     Vitamin D 2000 UNITS Caps  Take 1 capsule by mouth 2 (two) times daily.      ASK your doctor about these medications        prenatal vitamin w/FE, FA 29-1 MG Chew chewable tablet  Chew 1 tablet by mouth daily at 12 noon.        Diet: routine diet  Activity: Advance as tolerated. Pelvic rest for 6 weeks.   Outpatient follow up: 1 week for Nexplanon Placement. 6 weeks for typical PP check  Iron supplementation.   Postpartum contraception: Nexplanon  Newborn Data: Live born female  Birth Weight: 7 lb 12.7 oz (3535 g) APGAR: 8, 9  Baby Feeding: Breast Disposition:home with mother  Federico FlakeKimberly Niles Dallon Dacosta, MD 7:58 AM

## 2015-06-08 ENCOUNTER — Encounter: Payer: Self-pay | Admitting: Student

## 2015-06-22 ENCOUNTER — Inpatient Hospital Stay (HOSPITAL_COMMUNITY)
Admission: AD | Admit: 2015-06-22 | Discharge: 2015-06-22 | Disposition: A | Discharge status: Home / Self Care | Payer: Self-pay | Source: Ambulatory Visit | Attending: Family Medicine | Admitting: Family Medicine

## 2015-06-22 ENCOUNTER — Encounter (HOSPITAL_COMMUNITY): Payer: Self-pay | Admitting: *Deleted

## 2015-06-22 DIAGNOSIS — E039 Hypothyroidism, unspecified: Secondary | ICD-10-CM | POA: Insufficient documentation

## 2015-06-22 DIAGNOSIS — G44209 Tension-type headache, unspecified, not intractable: Secondary | ICD-10-CM | POA: Insufficient documentation

## 2015-06-22 DIAGNOSIS — K648 Other hemorrhoids: Secondary | ICD-10-CM

## 2015-06-22 DIAGNOSIS — O872 Hemorrhoids in the puerperium: Secondary | ICD-10-CM | POA: Insufficient documentation

## 2015-06-22 DIAGNOSIS — O9089 Other complications of the puerperium, not elsewhere classified: Secondary | ICD-10-CM | POA: Insufficient documentation

## 2015-06-22 MED ORDER — HYDROCORTISONE ACE-PRAMOXINE 1-1 % RE FOAM: 1.0000 | Freq: Two times a day (BID) | RECTAL | Status: DC

## 2015-06-22 NOTE — MAU Note (Addendum)
Vaginal Del on 11/28, has hemorrhoids. Can't go to the bathroom, it is really bothering her. Has a flight on Sunday, 20 hour flight.  Wanting to get treated prior to being seen.  Denies BP problem.  Has had a BM- yesterday.  Has been having daily headaches, doesn't have right now, ibuprofen  helps

## 2015-06-22 NOTE — MAU Provider Note (Signed)
History     CSN: [id number]  Arrival date and time: 06/22/15 1452   First Provider Initiated Contact with Patient 06/22/15 1710      No chief complaint on file.  HPI Ms. Amanda Dunlap is a 34 y.o. [id number] who is PPD #17 after SVD who presents to MAU today with complaint of hemorrhoids. The patient states rectal pain x 1 week. She has been taking Ibuprofen and using Vaseline with some relief. She states that she may have had some light bleeding from the rectum last night. She is taking Colace. She denies significant abdominal pain or fever. She is breastfeeding. She has also noted intermittent unilateral headache. She states this is relieved with Ibuprofen. She denies issues with blood pressure. She denies change in pain with change in position.  OB History    Gravida Para Term Preterm AB TAB SAB Ectopic Multiple Living   6 6 6       0 6      Past Medical History  Diagnosis Date  . Hypothyroidism     History reviewed. No pertinent past surgical history.  Family History  Problem Relation Age of Onset  . Hypertension Mother   . Hypertension Father     Social History  Substance Use Topics  . Smoking status: Never Smoker   . Smokeless tobacco: None  . Alcohol Use: No    Allergies: No Known Allergies  Prescriptions prior to admission  Medication Sig Dispense Refill Last Dose  . CALCIUM PO Take 1 tablet by mouth daily. Unknown dose   06/03/2015 at 0800  . Cholecalciferol (VITAMIN D) 2000 UNITS CAPS Take 1 capsule by mouth 2 (two) times daily.   06/04/2015 at 0800  . docusate sodium (COLACE) 100 MG capsule Take 1 capsule (100 mg total) by mouth 2 (two) times daily. 60 capsule 2   . ferrous sulfate 325 (65 FE) MG tablet Take 1 tablet (325 mg total) by mouth daily. 30 tablet 3   . ibuprofen (ADVIL,MOTRIN) 600 MG tablet Take 1 tablet (600 mg total) by mouth every 6 (six) hours. 90 tablet 0   . prenatal vitamin w/FE, FA (NATACHEW) 29-1 MG CHEW chewable tablet Chew 1 tablet by  mouth daily at 12 noon.   06/04/2015 at 0800    Review of Systems  Constitutional: Negative for fever and malaise/fatigue.  Gastrointestinal: Negative for nausea, vomiting, abdominal pain, diarrhea and constipation.  Genitourinary:       + vaginal bleeding   Physical Exam   Blood pressure 129/78, pulse 84, temperature 98 F (36.7 C), temperature source Oral, resp. rate 18, currently breastfeeding.  Physical Exam  Nursing note and vitals reviewed. Constitutional: She is oriented to person, place, and time. She appears well-developed and well-nourished. No distress.  HENT:  Head: Normocephalic and atraumatic.  Cardiovascular: Normal rate.   Respiratory: Effort normal.  GI: Soft. She exhibits no distension and no mass. There is no tenderness. There is no rebound and no guarding.  Genitourinary: Rectal exam shows external hemorrhoid (very small, non-thrombosed). Rectal exam shows no fissure, no mass and anal tone normal.     Neurological: She is alert and oriented to person, place, and time.  Skin: Skin is warm and dry. No erythema.  Psychiatric: She has a normal mood and affect.    MAU Course  Procedures None   Assessment and Plan  A: PPD #17 Hemorrhoids Tension headache  P: Discharge home Rx for Proctofoam sent to patient's pharmacy Patient advised to continue Colace and  Miralax PRN to ensure soft stools Encouraged increased PO hydration  Continue Ibuprofen PRN for pain/headache Heat to the neck and support for breastfeeding may also be helpful Warning signs for worsening condition discussed Patient advised to follow-up with [facility] as planned for routine PP care, although patient plans to leave the country soon and may need to follow-up at home Patient may return to MAU as needed or if her condition were to change or worsen   Marny Lowenstein, PA-C  06/22/2015, 5:26 PM

## 2015-06-22 NOTE — Discharge Instructions (Signed)
Hemorrhoids °Hemorrhoids are puffy (swollen) veins around the rectum or anus. Hemorrhoids can cause pain, itching, bleeding, or irritation. °HOME CARE °· Eat foods with fiber, such as whole grains, beans, nuts, fruits, and vegetables. Ask your doctor about taking products with added fiber in them (fiber supplements).  °· Drink enough fluid to keep your pee (urine) clear or pale yellow. °· Exercise often. °· Go to the bathroom when you have the urge to poop. Do not wait. °· Avoid straining to poop (bowel movement). °· Keep the butt area dry and clean. Use wet toilet paper or moist paper towels. °· Medicated creams and medicine inserted into the anus (anal suppository) may be used or applied as told. °· Only take medicine as told by your doctor. °· Take a warm water bath (sitz bath) for 15-20 minutes to ease pain. Do this 3-4 times a day. °· Place ice packs on the area if it is tender or puffy. Use the ice packs between the warm water baths. °¨ Put ice in a plastic bag. °¨ Place a towel between your skin and the bag. °¨ Leave the ice on for 15-20 minutes, 03-04 times a day. °· Do not use a donut-shaped pillow or sit on the toilet for a long time. °GET HELP RIGHT AWAY IF:  °· You have more pain that is not controlled by treatment or medicine. °· You have bleeding that will not stop. °· You have trouble or are unable to poop (bowel movement). °· You have pain or puffiness outside the area of the hemorrhoids. °MAKE SURE YOU:  °· Understand these instructions. °· Will watch your condition. °· Will get help right away if you are not doing well or get worse. °  °This information is not intended to replace advice given to you by your health care provider. Make sure you discuss any questions you have with your health care provider. °  °Document Released: 04/02/2008 Document Revised: 06/10/2012 Document Reviewed: 05/05/2012 °Elsevier Interactive Patient Education ©2016 [employer] ° °High-Fiber Diet °Fiber, also called  dietary fiber, is a type of carbohydrate found in fruits, vegetables, whole grains, and beans. A high-fiber diet can have many health benefits. Your health care provider may recommend a high-fiber diet to help: °· Prevent constipation. Fiber can make your bowel movements more regular. °· Lower your cholesterol. °· Relieve hemorrhoids, uncomplicated diverticulosis, or irritable bowel syndrome. °· Prevent overeating as part of a weight-loss plan. °· Prevent heart disease, type 2 diabetes, and certain cancers. °WHAT IS MY PLAN? °The recommended daily intake of fiber includes: °· 38 grams for men under age 50. °· 30 grams for men over age 50. °· 25 grams for women under age 50. °· 21 grams for women over age 50. °You can get the recommended daily intake of dietary fiber by eating a variety of fruits, vegetables, grains, and beans. Your health care provider may also recommend a fiber supplement if it is not possible to get enough fiber through your diet. °WHAT DO I NEED TO KNOW ABOUT A HIGH-FIBER DIET? °· Fiber supplements have not been widely studied for their effectiveness, so it is better to get fiber through food sources. °· Always check the fiber content on the nutrition facts label of any prepackaged food. Look for foods that contain at least 5 grams of fiber per serving. °· Ask your dietitian if you have questions about specific foods that are related to your condition, especially if those foods are not listed in the following section. °· Increase your   daily fiber consumption gradually. Increasing your intake of dietary fiber too quickly may cause bloating, cramping, or gas. °· Drink plenty of water. Water helps you to digest fiber. °WHAT FOODS CAN I EAT? °Grains °Whole-grain breads. Multigrain cereal. Oats and oatmeal. Brown rice. Barley. Bulgur wheat. Millet. Bran muffins. Popcorn. Rye wafer crackers. °Vegetables °Sweet potatoes. Spinach. Kale. Artichokes. Cabbage. Broccoli. Green peas. Carrots.  Squash. °Fruits °Berries. Pears. Apples. Oranges. Avocados. Prunes and raisins. Dried figs. °Meats and Other Protein Sources °Navy, kidney, pinto, and soy beans. Split peas. Lentils. Nuts and seeds. °Dairy °Fiber-fortified yogurt. °Beverages °Fiber-fortified soy milk. Fiber-fortified orange juice. °Other °Fiber bars. °The items listed above may not be a complete list of recommended foods or beverages. Contact your dietitian for more options. °WHAT FOODS ARE NOT RECOMMENDED? °Grains °White bread. Pasta made with refined flour. White rice. °Vegetables °Fried potatoes. Canned vegetables. Well-cooked vegetables.  °Fruits °Fruit juice. Cooked, strained fruit. °Meats and Other Protein Sources °Fatty cuts of meat. Fried poultry or fried fish. °Dairy °Milk. Yogurt. Cream cheese. Sour cream. °Beverages °Soft drinks. °Other °Cakes and pastries. Butter and oils. °The items listed above may not be a complete list of foods and beverages to avoid. Contact your dietitian for more information. °WHAT ARE SOME TIPS FOR INCLUDING HIGH-FIBER FOODS IN MY DIET? °· Eat a wide variety of high-fiber foods. °· Make sure that half of all grains consumed each day are whole grains. °· Replace breads and cereals made from refined flour or white flour with whole-grain breads and cereals. °· Replace white rice with brown rice, bulgur wheat, or millet. °· Start the day with a breakfast that is high in fiber, such as a cereal that contains at least 5 grams of fiber per serving. °· Use beans in place of meat in soups, salads, or pasta. °· Eat high-fiber snacks, such as berries, raw vegetables, nuts, or popcorn. °  °This information is not intended to replace advice given to you by your health care provider. Make sure you discuss any questions you have with your health care provider. °  °Document Released: 06/24/2005 Document Revised: 07/15/2014 Document Reviewed: 12/07/2013 °Elsevier Interactive Patient Education ©2016 [employer] Inc. ° °

## 2015-07-12 ENCOUNTER — Ambulatory Visit: Payer: Self-pay | Admitting: Advanced Practice Midwife

## 2016-07-08 NOTE — L&D Delivery Note (Signed)
Delivery Note At 7:07 AM a viable female was delivered via Vaginal, Spontaneous (Presentation: vertex; OA).  APGAR: 9, 9; weight 7 lb 0.9 oz (3200 g).   Placenta status: spontaneous, intact via Tomasa BlaseSchultz.  Cord: 3VC with complications. Cord pH: n/a  Anesthesia: Epidural  Episiotomy: None Lacerations: 2nd degree; Vaginal Suture Repair: 2.0 Vicryl Est. Blood Loss (mL): 100  Mom to postpartum.  Baby to Couplet care / Skin to Skin.  Raelyn Moraolitta Alleyne Lac, MSN, CNM 06/29/2017, 9:06 AM

## 2016-12-05 LAB — OB RESULTS CONSOLE ABO/RH: RH TYPE: POSITIVE

## 2016-12-05 LAB — OB RESULTS CONSOLE RPR: RPR: NONREACTIVE

## 2017-06-09 ENCOUNTER — Encounter: Payer: Self-pay | Admitting: *Deleted

## 2017-06-23 ENCOUNTER — Encounter: Payer: Self-pay | Admitting: Obstetrics and Gynecology

## 2017-06-23 ENCOUNTER — Ambulatory Visit (INDEPENDENT_AMBULATORY_CARE_PROVIDER_SITE_OTHER): Payer: Self-pay | Admitting: Obstetrics and Gynecology

## 2017-06-23 VITALS — BP 121/77 | HR 91 | Wt 232.8 lb

## 2017-06-23 DIAGNOSIS — Z23 Encounter for immunization: Secondary | ICD-10-CM

## 2017-06-23 DIAGNOSIS — Z3009 Encounter for other general counseling and advice on contraception: Secondary | ICD-10-CM

## 2017-06-23 DIAGNOSIS — O09893 Supervision of other high risk pregnancies, third trimester: Secondary | ICD-10-CM

## 2017-06-23 DIAGNOSIS — O09899 Supervision of other high risk pregnancies, unspecified trimester: Secondary | ICD-10-CM | POA: Insufficient documentation

## 2017-06-23 DIAGNOSIS — E039 Hypothyroidism, unspecified: Secondary | ICD-10-CM | POA: Insufficient documentation

## 2017-06-23 DIAGNOSIS — O24415 Gestational diabetes mellitus in pregnancy, controlled by oral hypoglycemic drugs: Secondary | ICD-10-CM

## 2017-06-23 HISTORY — DX: Hypothyroidism, unspecified: E03.9

## 2017-06-23 LAB — POCT URINALYSIS DIP (DEVICE)
Bilirubin Urine: NEGATIVE
Glucose, UA: NEGATIVE mg/dL
Hgb urine dipstick: NEGATIVE
KETONES UR: NEGATIVE mg/dL
LEUKOCYTES UA: NEGATIVE
Nitrite: NEGATIVE
PROTEIN: NEGATIVE mg/dL
Specific Gravity, Urine: 1.025 (ref 1.005–1.030)
UROBILINOGEN UA: 0.2 mg/dL (ref 0.0–1.0)
pH: 6 (ref 5.0–8.0)

## 2017-06-23 NOTE — Progress Notes (Signed)
Subjective:   Amanda Dunlap is a 36 y.o. G7P6006 at 7658w2d by LMP being seen today for her first obstetrical visit.  Her obstetrical history is significant for A2 DM, transfer or care. She moved here from [place] 3 weeks ago to complete her care here and deliver her baby [place] in the [place]. She was diagnosed with DM with this pregnancy. She is taking Metformin 500 mg QHS. She has been checking her BS fasting first thing in the AM and states they are always <95.  Patient does intend to breast feed. Pregnancy history fully reviewed.  Patient reports no complaints.  HISTORY: Obstetric History   G7   P6   T6   P0   A0   L6    SAB0   TAB0   Ectopic0   Multiple0   Live Births6     # Outcome Date GA Lbr Len/2nd Weight Sex Delivery Anes PTL Lv  7 Current           6 Term 06/05/15 386w6d 05:10 / 00:11 7 lb 12.7 oz (3.535 kg) M Vag-Spont EPI  LIV     Name: Whack,BOY Anjela     Apgar1:  8                Apgar5: 9  5 Term 2015     Vag-Spont   LIV  4 Term 2011     Vag-Spont   LIV  3 Term 2007     Vag-Spont   LIV  2 Term 2004     Vag-Spont   LIV  1 Term 2003 5668w0d  7 lb 11.5 oz (3.5 kg) F Vag-Spont Gen N LIV     Past Medical History:  Diagnosis Date  . AMA (advanced maternal age) multigravida 35+   . Diabetes mellitus without complication (HCC)   . Hypothyroidism 06/23/2017   History reviewed. No pertinent surgical history. Family History  Problem Relation Age of Onset  . Hypertension Mother   . Diabetes Mother   . Hypertension Father    Social History   Tobacco Use  . Smoking status: Never Smoker  . Smokeless tobacco: Never Used  Substance Use Topics  . Alcohol use: No  . Drug use: No   No Known Allergies Current Outpatient Medications on File Prior to Visit  Medication Sig Dispense Refill  . prenatal vitamin w/FE, FA (NATACHEW) 29-1 MG CHEW chewable tablet Chew 1 tablet by mouth daily at 12 noon.    Marland Kitchen. CALCIUM PO Take 1 tablet by mouth daily. Unknown dose    . Cholecalciferol  (VITAMIN D) 2000 UNITS CAPS Take 1 capsule by mouth 2 (two) times daily.    Marland Kitchen. docusate sodium (COLACE) 100 MG capsule Take 1 capsule (100 mg total) by mouth 2 (two) times daily. (Patient not taking: Reported on 06/23/2017) 60 capsule 2  . ferrous sulfate 325 (65 FE) MG tablet Take 1 tablet (325 mg total) by mouth daily. (Patient not taking: Reported on 06/23/2017) 30 tablet 3  . hydrocortisone-pramoxine (PROCTOFOAM HC) rectal foam Place 1 applicator rectally 2 (two) times daily. (Patient not taking: Reported on 06/23/2017) 10 g 0  . ibuprofen (ADVIL,MOTRIN) 600 MG tablet Take 1 tablet (600 mg total) by mouth every 6 (six) hours. (Patient not taking: Reported on 06/23/2017) 90 tablet 0   No current facility-administered medications on file prior to visit.      Exam   Vitals:   06/23/17 1449  BP: 121/77  Pulse: 91  Weight:  232 lb 12.8 oz (105.6 kg)   Fetal Heart Rate (bpm): 140  Uterus:  Fundal Height: 38 cm  Pelvic Exam: Perineum: no hemorrhoids, normal perineum   Vulva: normal external genitalia, no lesions   Adnexa: normal adnexa and no mass, fullness, tenderness   Bony Pelvis: average  System: General: well-developed, well-nourished female in no acute distress   Breast:  normal appearance, no masses or tenderness   Skin: normal coloration and turgor, no rashes   Neurologic: oriented, normal, negative, normal mood   Extremities: normal strength, tone, and muscle mass, ROM of all joints is normal   HEENT PERRLA, extraocular movement intact and sclera clear, anicteric   Mouth/Teeth mucous membranes moist, pharynx normal without lesions and dental hygiene good   Neck supple and no masses   Cardiovascular: regular rate and rhythm   Respiratory:  no respiratory distress, normal breath sounds   Abdomen: soft, non-tender; bowel sounds normal; no masses,  no organomegaly     Assessment:   Pregnancy: [id number] Patient Active Problem List   Diagnosis Date Noted  . Supervision of other  high risk pregnancy, antepartum 06/23/2017  . Unwanted fertility 06/23/2017  . Hypothyroidism 06/23/2017  . PPH (postpartum hemorrhage) 06/07/2015  . Grand multiparity with current pregnancy, antepartum 05/22/2015  . AMA (advanced maternal age) multigravida 35+ 05/22/2015  . Vitamin D deficiency 05/10/2015     Plan:   1. Supervision of other high risk pregnancy, antepartum  - Culture, beta strep (group b only) - Flu Vaccine QUAD 36+ mos IM - Tdap vaccine greater than or equal to 7yo IM - TSH - T4, free - CBC - RPR - HIV antibody  US MFM OB detailed scheduled on 12/19   2. Unwanted fertility  Would like Tubal ligation, unsure how she is going to pay for it   3. Gestational diabetes mellitus (GDM) in third trimester controlled on oral hypoglycemic drug  - Fetal nonstress test reactive -Baseline 140, +accels, no decels. No contractions  - Continue Metformin  - Continue monitoring BS   Initial labs drawn. Continue prenatal vitamins. Genetic Screening discussed, Too late . Ultrasound discussed; fetal anatomic survey: scheduled. Problem list reviewed and updated. The nature of [facility] with multiple MDs and other Advanced Practice Providers was explained to patient; also emphasized that residents, students are part of our team. Routine obstetric precautions reviewed. Return in about 6 days (around 06/29/2017), or For induction of labor .    Duane Lopeasch, Jerriann Schrom I, NP 06/27/2017 [facility]

## 2017-06-23 NOTE — Patient Instructions (Signed)
Ultrasound on 12/19 @ 1:30

## 2017-06-24 ENCOUNTER — Encounter (HOSPITAL_COMMUNITY): Payer: Self-pay | Admitting: *Deleted

## 2017-06-24 ENCOUNTER — Other Ambulatory Visit: Payer: Self-pay | Admitting: Family Medicine

## 2017-06-24 ENCOUNTER — Telehealth (HOSPITAL_COMMUNITY): Payer: Self-pay | Admitting: *Deleted

## 2017-06-24 LAB — CBC
HEMOGLOBIN: 12.9 g/dL (ref 11.1–15.9)
Hematocrit: 37.5 % (ref 34.0–46.6)
MCH: 27.7 pg (ref 26.6–33.0)
MCHC: 34.4 g/dL (ref 31.5–35.7)
MCV: 81 fL (ref 79–97)
PLATELETS: 196 10*3/uL (ref 150–379)
RBC: 4.66 x10E6/uL (ref 3.77–5.28)
RDW: 16.2 % — ABNORMAL HIGH (ref 12.3–15.4)
WBC: 5.7 10*3/uL (ref 3.4–10.8)

## 2017-06-24 LAB — T4, FREE: Free T4: 1.06 ng/dL (ref 0.82–1.77)

## 2017-06-24 LAB — TSH: TSH: 1.06 u[IU]/mL (ref 0.450–4.500)

## 2017-06-24 LAB — RPR: RPR: NONREACTIVE

## 2017-06-24 LAB — HIV ANTIBODY (ROUTINE TESTING W REFLEX): HIV Screen 4th Generation wRfx: NONREACTIVE

## 2017-06-24 NOTE — Telephone Encounter (Signed)
Preadmission screen Interpreter number [phone]

## 2017-06-25 ENCOUNTER — Encounter (HOSPITAL_COMMUNITY): Payer: Self-pay

## 2017-06-25 ENCOUNTER — Other Ambulatory Visit: Payer: Self-pay | Admitting: Obstetrics and Gynecology

## 2017-06-25 ENCOUNTER — Ambulatory Visit (HOSPITAL_COMMUNITY)
Admission: RE | Admit: 2017-06-25 | Discharge: 2017-06-25 | Disposition: A | Discharge status: Home / Self Care | Payer: Self-pay | Source: Ambulatory Visit | Attending: Obstetrics and Gynecology | Admitting: Obstetrics and Gynecology

## 2017-06-25 DIAGNOSIS — E039 Hypothyroidism, unspecified: Secondary | ICD-10-CM | POA: Insufficient documentation

## 2017-06-25 DIAGNOSIS — O24415 Gestational diabetes mellitus in pregnancy, controlled by oral hypoglycemic drugs: Secondary | ICD-10-CM | POA: Insufficient documentation

## 2017-06-25 DIAGNOSIS — O09523 Supervision of elderly multigravida, third trimester: Secondary | ICD-10-CM | POA: Insufficient documentation

## 2017-06-25 DIAGNOSIS — O0933 Supervision of pregnancy with insufficient antenatal care, third trimester: Secondary | ICD-10-CM | POA: Insufficient documentation

## 2017-06-25 DIAGNOSIS — O99283 Endocrine, nutritional and metabolic diseases complicating pregnancy, third trimester: Secondary | ICD-10-CM

## 2017-06-25 DIAGNOSIS — O99280 Endocrine, nutritional and metabolic diseases complicating pregnancy, unspecified trimester: Secondary | ICD-10-CM | POA: Insufficient documentation

## 2017-06-25 DIAGNOSIS — Z3A38 38 weeks gestation of pregnancy: Secondary | ICD-10-CM

## 2017-06-25 DIAGNOSIS — Z3689 Encounter for other specified antenatal screening: Secondary | ICD-10-CM | POA: Insufficient documentation

## 2017-06-25 DIAGNOSIS — Z3009 Encounter for other general counseling and advice on contraception: Secondary | ICD-10-CM

## 2017-06-25 DIAGNOSIS — O09899 Supervision of other high risk pregnancies, unspecified trimester: Secondary | ICD-10-CM

## 2017-06-25 NOTE — Addendum Note (Signed)
Encounter addended by: Marcellina MillinMoser, Clayborn Milnes L, RTR on: 06/25/2017 2:44 PM  Actions taken: Imaging Exam ended

## 2017-06-27 LAB — CULTURE, BETA STREP (GROUP B ONLY): Strep Gp B Culture: NEGATIVE

## 2017-06-29 ENCOUNTER — Inpatient Hospital Stay (HOSPITAL_COMMUNITY): Payer: Medicaid Other | Admitting: Anesthesiology

## 2017-06-29 ENCOUNTER — Encounter (HOSPITAL_COMMUNITY): Payer: Self-pay

## 2017-06-29 ENCOUNTER — Inpatient Hospital Stay (HOSPITAL_COMMUNITY)
Admission: RE | Admit: 2017-06-29 | Discharge: 2017-07-01 | DRG: 807 | Disposition: A | Discharge status: Home / Self Care | Payer: Medicaid Other | Source: Ambulatory Visit | Attending: Obstetrics & Gynecology | Admitting: Obstetrics & Gynecology

## 2017-06-29 ENCOUNTER — Other Ambulatory Visit: Payer: Self-pay

## 2017-06-29 DIAGNOSIS — Z3009 Encounter for other general counseling and advice on contraception: Secondary | ICD-10-CM | POA: Diagnosis present

## 2017-06-29 DIAGNOSIS — Z3A39 39 weeks gestation of pregnancy: Secondary | ICD-10-CM

## 2017-06-29 DIAGNOSIS — O24425 Gestational diabetes mellitus in childbirth, controlled by oral hypoglycemic drugs: Principal | ICD-10-CM | POA: Diagnosis present

## 2017-06-29 DIAGNOSIS — O09899 Supervision of other high risk pregnancies, unspecified trimester: Secondary | ICD-10-CM

## 2017-06-29 DIAGNOSIS — Z3490 Encounter for supervision of normal pregnancy, unspecified, unspecified trimester: Secondary | ICD-10-CM | POA: Diagnosis present

## 2017-06-29 LAB — CBC
HEMATOCRIT: 40.5 % (ref 36.0–46.0)
Hemoglobin: 13.6 g/dL (ref 12.0–15.0)
MCH: 28.2 pg (ref 26.0–34.0)
MCHC: 33.6 g/dL (ref 30.0–36.0)
MCV: 83.9 fL (ref 78.0–100.0)
Platelets: 206 10*3/uL (ref 150–400)
RBC: 4.83 MIL/uL (ref 3.87–5.11)
RDW: 15.9 % — AB (ref 11.5–15.5)
WBC: 5.8 10*3/uL (ref 4.0–10.5)

## 2017-06-29 LAB — TYPE AND SCREEN
ABO/RH(D): O POS
Antibody Screen: NEGATIVE

## 2017-06-29 LAB — HEPATITIS B SURFACE ANTIGEN: HEP B S AG: NEGATIVE

## 2017-06-29 LAB — RPR: RPR Ser Ql: NONREACTIVE

## 2017-06-29 LAB — GLUCOSE, CAPILLARY: Glucose-Capillary: 78 mg/dL (ref 65–99)

## 2017-06-29 MED ORDER — EPHEDRINE 5 MG/ML INJ
10.0000 mg | INTRAVENOUS | Status: DC | PRN
  Filled 2017-06-29: qty 2

## 2017-06-29 MED ORDER — SENNOSIDES-DOCUSATE SODIUM 8.6-50 MG PO TABS
2.0000 | ORAL_TABLET | ORAL | Status: DC
  Administered 2017-06-30 (×2): 2 via ORAL
  Filled 2017-06-29 (×2): qty 2

## 2017-06-29 MED ORDER — TETANUS-DIPHTH-ACELL PERTUSSIS 5-2.5-18.5 LF-MCG/0.5 IM SUSP: 0.5000 mL | Freq: Once | INTRAMUSCULAR | Status: DC

## 2017-06-29 MED ORDER — OXYTOCIN 40 UNITS IN LACTATED RINGERS INFUSION - SIMPLE MED
2.5000 [IU]/h | INTRAVENOUS | Status: DC
  Filled 2017-06-29: qty 1000

## 2017-06-29 MED ORDER — TERBUTALINE SULFATE 1 MG/ML IJ SOLN
0.2500 mg | Freq: Once | INTRAMUSCULAR | Status: DC | PRN
  Filled 2017-06-29: qty 1

## 2017-06-29 MED ORDER — ONDANSETRON HCL 4 MG/2ML IJ SOLN: 4.0000 mg | Freq: Four times a day (QID) | INTRAMUSCULAR | Status: DC | PRN

## 2017-06-29 MED ORDER — PRENATAL MULTIVITAMIN CH
1.0000 | ORAL_TABLET | Freq: Every day | ORAL | Status: DC
  Administered 2017-06-30 – 2017-07-01 (×2): 1 via ORAL
  Filled 2017-06-29 (×2): qty 1

## 2017-06-29 MED ORDER — OXYTOCIN BOLUS FROM INFUSION
500.0000 mL | Freq: Once | INTRAVENOUS | Status: AC
  Administered 2017-06-29: 500 mL via INTRAVENOUS

## 2017-06-29 MED ORDER — SOD CITRATE-CITRIC ACID 500-334 MG/5ML PO SOLN: 30.0000 mL | ORAL | Status: DC | PRN

## 2017-06-29 MED ORDER — PHENYLEPHRINE 40 MCG/ML (10ML) SYRINGE FOR IV PUSH (FOR BLOOD PRESSURE SUPPORT)
80.0000 ug | PREFILLED_SYRINGE | INTRAVENOUS | Status: DC | PRN
  Filled 2017-06-29: qty 5

## 2017-06-29 MED ORDER — DIPHENHYDRAMINE HCL 50 MG/ML IJ SOLN: 12.5000 mg | INTRAMUSCULAR | Status: DC | PRN

## 2017-06-29 MED ORDER — SIMETHICONE 80 MG PO CHEW
80.0000 mg | CHEWABLE_TABLET | ORAL | Status: DC | PRN
  Administered 2017-06-29 – 2017-07-01 (×2): 80 mg via ORAL
  Filled 2017-06-29 (×2): qty 1

## 2017-06-29 MED ORDER — ONDANSETRON HCL 4 MG/2ML IJ SOLN: 4.0000 mg | INTRAMUSCULAR | Status: DC | PRN

## 2017-06-29 MED ORDER — FLEET ENEMA 7-19 GM/118ML RE ENEM: 1.0000 | ENEMA | Freq: Every day | RECTAL | Status: DC | PRN

## 2017-06-29 MED ORDER — MISOPROSTOL 25 MCG QUARTER TABLET
25.0000 ug | ORAL_TABLET | ORAL | Status: DC | PRN
  Administered 2017-06-29: 25 ug via VAGINAL
  Filled 2017-06-29 (×3): qty 1

## 2017-06-29 MED ORDER — LACTATED RINGERS IV SOLN
INTRAVENOUS | Status: DC
  Administered 2017-06-29: 01:00:00 via INTRAVENOUS

## 2017-06-29 MED ORDER — DIPHENHYDRAMINE HCL 25 MG PO CAPS: 25.0000 mg | ORAL_CAPSULE | Freq: Four times a day (QID) | ORAL | Status: DC | PRN

## 2017-06-29 MED ORDER — WITCH HAZEL-GLYCERIN EX PADS
1.0000 "application " | MEDICATED_PAD | CUTANEOUS | Status: DC | PRN
  Administered 2017-07-01: 1 via TOPICAL

## 2017-06-29 MED ORDER — ONDANSETRON HCL 4 MG PO TABS
4.0000 mg | ORAL_TABLET | ORAL | Status: DC | PRN
Start: 2017-06-29 — End: 2017-07-01

## 2017-06-29 MED ORDER — OXYTOCIN 40 UNITS IN LACTATED RINGERS INFUSION - SIMPLE MED
1.0000 m[IU]/min | INTRAVENOUS | Status: DC
  Administered 2017-06-29: 2 m[IU]/min via INTRAVENOUS

## 2017-06-29 MED ORDER — ZOLPIDEM TARTRATE 5 MG PO TABS: 5.0000 mg | ORAL_TABLET | Freq: Every evening | ORAL | Status: DC | PRN

## 2017-06-29 MED ORDER — LIDOCAINE HCL (PF) 1 % IJ SOLN
30.0000 mL | INTRAMUSCULAR | Status: DC | PRN
  Filled 2017-06-29: qty 30

## 2017-06-29 MED ORDER — DIBUCAINE 1 % RE OINT
1.0000 "application " | TOPICAL_OINTMENT | RECTAL | Status: DC | PRN
  Administered 2017-07-01: 1 via RECTAL
  Filled 2017-06-29: qty 28

## 2017-06-29 MED ORDER — BENZOCAINE-MENTHOL 20-0.5 % EX AERO
1.0000 "application " | INHALATION_SPRAY | CUTANEOUS | Status: DC | PRN
  Administered 2017-06-29: 1 via TOPICAL
  Filled 2017-06-29: qty 56

## 2017-06-29 MED ORDER — LACTATED RINGERS IV SOLN: 500.0000 mL | Freq: Once | INTRAVENOUS | Status: DC

## 2017-06-29 MED ORDER — LACTATED RINGERS IV SOLN: 500.0000 mL | INTRAVENOUS | Status: DC | PRN

## 2017-06-29 MED ORDER — ACETAMINOPHEN 325 MG PO TABS: 650.0000 mg | ORAL_TABLET | ORAL | Status: DC | PRN

## 2017-06-29 MED ORDER — FENTANYL 2.5 MCG/ML BUPIVACAINE 1/10 % EPIDURAL INFUSION (WH - ANES)
INTRAMUSCULAR | Status: AC
  Filled 2017-06-29: qty 100

## 2017-06-29 MED ORDER — PHENYLEPHRINE 40 MCG/ML (10ML) SYRINGE FOR IV PUSH (FOR BLOOD PRESSURE SUPPORT)
PREFILLED_SYRINGE | INTRAVENOUS | Status: AC
  Filled 2017-06-29: qty 10

## 2017-06-29 MED ORDER — OXYCODONE-ACETAMINOPHEN 5-325 MG PO TABS
1.0000 | ORAL_TABLET | ORAL | Status: DC | PRN
Start: 2017-06-29 — End: 2017-06-29

## 2017-06-29 MED ORDER — FENTANYL 2.5 MCG/ML BUPIVACAINE 1/10 % EPIDURAL INFUSION (WH - ANES)
14.0000 mL/h | INTRAMUSCULAR | Status: DC | PRN
  Administered 2017-06-29: 14 mL/h via EPIDURAL

## 2017-06-29 MED ORDER — OXYCODONE-ACETAMINOPHEN 5-325 MG PO TABS: 2.0000 | ORAL_TABLET | ORAL | Status: DC | PRN

## 2017-06-29 MED ORDER — COCONUT OIL OIL
1.0000 "application " | TOPICAL_OIL | Status: DC | PRN
  Administered 2017-06-30: 1 via TOPICAL
  Filled 2017-06-29: qty 120

## 2017-06-29 MED ORDER — IBUPROFEN 600 MG PO TABS
600.0000 mg | ORAL_TABLET | Freq: Four times a day (QID) | ORAL | Status: DC
  Administered 2017-06-29 – 2017-07-01 (×10): 600 mg via ORAL
  Filled 2017-06-29 (×10): qty 1

## 2017-06-29 MED ORDER — LIDOCAINE HCL (PF) 1 % IJ SOLN
INTRAMUSCULAR | Status: DC | PRN
  Administered 2017-06-29 (×2): 5 mL

## 2017-06-29 MED ORDER — ACETAMINOPHEN 325 MG PO TABS
650.0000 mg | ORAL_TABLET | ORAL | Status: DC | PRN
  Administered 2017-06-29: 650 mg via ORAL
  Filled 2017-06-29: qty 2

## 2017-06-29 NOTE — H&P (Signed)
LABOR AND DELIVERY ADMISSION HISTORY AND PHYSICAL NOTE  Amanda Dunlap is a 36 y.o. female [phone] with IUP at 2222w1d by LMP presenting for IOL for GDM A2 on Metformin.   She reports positive fetal movement. She reports leakage of fluid since last night She denies vaginal bleeding.  Prenatal History/Complications: GDMA2 on Metformin, denies checking BS in > 10days, states "my FBSs were all <95"  Past Medical History: Past Medical History:  Diagnosis Date  . AMA (advanced maternal age) multigravida 35+   . Diabetes mellitus without complication (HCC)   . Hypothyroidism 06/23/2017    Past Surgical History: Past Surgical History:  Procedure Laterality Date  . NO PAST SURGERIES      Obstetrical History: OB History    Gravida Para Term Preterm AB Living   7 6 6     6    SAB TAB Ectopic Multiple Live Births         0 6      Social History: Social History   Socioeconomic History  . Marital status: Married    Spouse name: None  . Number of children: None  . Years of education: None  . Highest education level: None  Social Needs  . Financial resource strain: None  . Food insecurity - worry: None  . Food insecurity - inability: None  . Transportation needs - medical: None  . Transportation needs - non-medical: None  Occupational History  . None  Tobacco Use  . Smoking status: Never Smoker  . Smokeless tobacco: Never Used  Substance and Sexual Activity  . Alcohol use: No  . Drug use: No  . Sexual activity: Not Currently    Birth control/protection: None  Other Topics Concern  . None  Social History Narrative  . None    Family History: Family History  Problem Relation Age of Onset  . Hypertension Mother   . Diabetes Mother   . Hypertension Father     Allergies: No Known Allergies  Medications Prior to Admission  Medication Sig Dispense Refill Last Dose  . METFORMIN HCL PO Take by mouth.   06/28/2017 at Unknown time  . prenatal vitamin w/FE, FA (NATACHEW)  29-1 MG CHEW chewable tablet Chew 1 tablet by mouth daily at 12 noon.   06/28/2017 at Unknown time  . CALCIUM PO Take 1 tablet by mouth daily. Unknown dose   Not Taking  . Cholecalciferol (VITAMIN D) 2000 UNITS CAPS Take 1 capsule by mouth 2 (two) times daily.   Not Taking  . docusate sodium (COLACE) 100 MG capsule Take 1 capsule (100 mg total) by mouth 2 (two) times daily. (Patient not taking: Reported on 06/23/2017) 60 capsule 2 Not Taking  . ferrous sulfate 325 (65 FE) MG tablet Take 1 tablet (325 mg total) by mouth daily. (Patient not taking: Reported on 06/23/2017) 30 tablet 3 Not Taking  . hydrocortisone-pramoxine (PROCTOFOAM HC) rectal foam Place 1 applicator rectally 2 (two) times daily. (Patient not taking: Reported on 06/23/2017) 10 g 0 Not Taking  . ibuprofen (ADVIL,MOTRIN) 600 MG tablet Take 1 tablet (600 mg total) by mouth every 6 (six) hours. (Patient not taking: Reported on 06/23/2017) 90 tablet 0 Not Taking     Review of Systems   All systems reviewed and negative except as stated in HPI  Physical Exam Blood pressure (!) 135/94, pulse 90, temperature 98.3 F (36.8 C), temperature source Oral, resp. rate 18, height 5' 4.96" (1.65 m), weight 238 lb (108 kg), last menstrual period 09/28/2016. General  appearance: alert, cooperative and no distress Lungs: clear to auscultation bilaterally Heart: regular rate and rhythm Abdomen: soft, non-tender; bowel sounds normal Extremities: No calf swelling or tenderness Presentation: cephalic Fetal monitoring: 145 Uterine activity: 1 UC Dilation: Closed Effacement (%): 50 Station: -3 Exam by:: Carloyn Jaeger Aedan Geimer, CNM Cytotec 25 mg placed vaginally without difficulty  Prenatal labs: ABO, Rh: O/Positive/-- (05/31 0000) Antibody:  unknown Rubella:  unknown RPR: Non Reactive (12/17 1618)  HBsAg:   pending HIV:   Non-Reactive GBS:  Negative 1 hr Glucola: GDMA2 on Metformin Genetic screening:  Late to care Anatomy US: normal  Prenatal  Transfer Tool  Maternal Diabetes: Yes:  Diabetes Type:  Insulin/Medication controlled Genetic Screening: Declined -- too late Maternal Ultrasounds/Referrals: Normal Fetal Ultrasounds or other Referrals:  None Maternal Substance Abuse:  No Significant Maternal Medications:  Meds include: Other:  Metformin Significant Maternal Lab Results: Lab values include: Group B Strep negative  Results for orders placed or performed during the hospital encounter of 06/29/17 (from the past 24 hour(s))  CBC   Collection Time: 06/29/17  1:00 AM  Result Value Ref Range   WBC 5.8 4.0 - 10.5 K/uL   RBC 4.83 3.87 - 5.11 MIL/uL   Hemoglobin 13.6 12.0 - 15.0 g/dL   HCT 16.140.5 09.636.0 - 04.546.0 %   MCV 83.9 78.0 - 100.0 fL   MCH 28.2 26.0 - 34.0 pg   MCHC 33.6 30.0 - 36.0 g/dL   RDW 40.915.9 (H) 81.111.5 - 91.415.5 %   Platelets 206 150 - 400 K/uL    Patient Active Problem List   Diagnosis Date Noted  . Encounter for planned induction of labor 06/29/2017  . Supervision of other high risk pregnancy, antepartum 06/23/2017  . Unwanted fertility 06/23/2017  . Hypothyroidism 06/23/2017  . PPH (postpartum hemorrhage) 06/07/2015  . Grand multiparity with current pregnancy, antepartum 05/22/2015  . AMA (advanced maternal age) multigravida 35+ 05/22/2015  . Vitamin D deficiency 05/10/2015    Assessment: Amanda Dunlap is a 36 y.o. G7P6006 at 7251w1d here for IOL for GDMA2  #Labor: IOL  #Pain: None at this time #FWB: 3756 gm #ID:  n/a #MOF: breast #MOC:unsure #Circ:  no  Raelyn Moraolitta Mathan Darroch, CNM 06/29/2017, 1:27 AM

## 2017-06-29 NOTE — Anesthesia Preprocedure Evaluation (Signed)
Anesthesia Evaluation  Patient identified by MRN, date of birth, ID band Patient awake    Reviewed: Allergy & Precautions, H&P , NPO status , Patient's Chart, lab work & pertinent test results  History of Anesthesia Complications Negative for: history of anesthetic complications  Airway Mallampati: II  TM Distance: >3 FB Neck ROM: full    Dental no notable dental hx. (+) Teeth Intact   Pulmonary neg pulmonary ROS,    Pulmonary exam normal breath sounds clear to auscultation       Cardiovascular negative cardio ROS Normal cardiovascular exam Rhythm:regular Rate:Normal     Neuro/Psych negative neurological ROS  negative psych ROS   GI/Hepatic negative GI ROS, Neg liver ROS,   Endo/Other  negative endocrine ROSdiabetes, Gestational  Renal/GU negative Renal ROS  negative genitourinary   Musculoskeletal   Abdominal   Peds  Hematology negative hematology ROS (+)   Anesthesia Other Findings   Reproductive/Obstetrics (+) Pregnancy                             Anesthesia Physical Anesthesia Plan  ASA: II  Anesthesia Plan: Epidural   Post-op Pain Management:    Induction:   PONV Risk Score and Plan:   Airway Management Planned:   Additional Equipment:   Intra-op Plan:   Post-operative Plan:   Informed Consent: I have reviewed the patients History and Physical, chart, labs and discussed the procedure including the risks, benefits and alternatives for the proposed anesthesia with the patient or authorized representative who has indicated his/her understanding and acceptance.     Plan Discussed with:   Anesthesia Plan Comments:         Anesthesia Quick Evaluation  

## 2017-06-29 NOTE — Anesthesia Postprocedure Evaluation (Signed)
Anesthesia Post Note  Patient: Amanda Dunlap  Procedure(s) Performed: AN AD HOC LABOR EPIDURAL     Patient location during evaluation: Mother Baby Anesthesia Type: Epidural Level of consciousness: awake and alert Pain management: pain level controlled Vital Signs Assessment: post-procedure vital signs reviewed and stable Respiratory status: spontaneous breathing, nonlabored ventilation and respiratory function stable Cardiovascular status: stable Postop Assessment: no headache, no backache, epidural receding and patient able to bend at knees Anesthetic complications: no    Last Vitals:  Vitals:   06/29/17 1015 06/29/17 1122  BP: 135/88 (!) 118/59  Pulse: 62 73  Resp: 18 16  Temp: 36.8 C 36.7 C    Last Pain:  Vitals:   06/29/17 1125  TempSrc:   PainSc: Asleep   Pain Goal:                 Rica RecordsICKELTON,Greogry Goodwyn

## 2017-06-29 NOTE — Progress Notes (Signed)
Interpreter offered, pt stated "no I speak English, I will tell you if I need one"

## 2017-06-29 NOTE — Anesthesia Procedure Notes (Signed)
Epidural Patient location during procedure: OB  Staffing Anesthesiologist: Aaradhya Kysar, MD Performed: anesthesiologist   Preanesthetic Checklist Completed: patient identified, site marked, surgical consent, pre-op evaluation, timeout performed, IV checked, risks and benefits discussed and monitors and equipment checked  Epidural Patient position: sitting Prep: DuraPrep Patient monitoring: heart rate, continuous pulse ox and blood pressure Approach: right paramedian Location: L3-L4 Injection technique: LOR saline  Needle:  Needle type: Tuohy  Needle gauge: 17 G Needle length: 9 cm and 9 Needle insertion depth: 6 cm Catheter type: closed end flexible Catheter size: 20 Guage Catheter at skin depth: 10 cm Test dose: negative  Assessment Events: blood not aspirated, injection not painful, no injection resistance, negative IV test and no paresthesia  Additional Notes Patient identified. Risks/Benefits/Options discussed with patient including but not limited to bleeding, infection, nerve damage, paralysis, failed block, incomplete pain control, headache, blood pressure changes, nausea, vomiting, reactions to medication both or allergic, itching and postpartum back pain. Confirmed with bedside nurse the patient's most recent platelet count. Confirmed with patient that they are not currently taking any anticoagulation, have any bleeding history or any family history of bleeding disorders. Patient expressed understanding and wished to proceed. All questions were answered. Sterile technique was used throughout the entire procedure. Please see nursing notes for vital signs. Test dose was given through epidural needle and negative prior to continuing to dose epidural or start infusion. Warning signs of high block given to the patient including shortness of breath, tingling/numbness in hands, complete motor block, or any concerning symptoms with instructions to call for help. Patient was given  instructions on fall risk and not to get out of bed. All questions and concerns addressed with instructions to call with any issues.     

## 2017-06-30 NOTE — Lactation Note (Signed)
This note was copied from a baby's chart. Lactation Consultation Note  Patient Name: Amanda Dunlap [id number]'WToday's Date: 06/30/2017 Reason for consult: Initial assessment Baby at 31 hr of life. Experienced bf mom denies breast or nipple pain. She does not think she has enough milk so the offered a formula bottle. Discussed baby behavior, feeding frequency, manual expression, spoon/cup feeding, artifical nipples, baby belly size, voids, wt loss, breast changes, and nipple care. Mom stated she can manually express. Given lactation handouts. Aware of OP services and support group.     Maternal Data    Feeding Feeding Type: Breast Fed Length of feed: 20 min  LATCH Score Latch: Grasps breast easily, tongue down, lips flanged, rhythmical sucking.  Audible Swallowing: A few with stimulation  Type of Nipple: Everted at rest and after stimulation  Comfort (Breast/Nipple): Soft / non-tender  Hold (Positioning): Assistance needed to correctly position infant at breast and maintain latch.  LATCH Score: 8  Interventions Interventions: Breast feeding basics reviewed;Skin to skin;Breast massage;Hand express;Coconut oil  Lactation Tools Discussed/Used     Consult Status Consult Status: Follow-up Date: 07/01/17 Follow-up type: In-patient    Rulon Eisenmengerlizabeth E Tashari Schoenfelder 06/30/2017, 2:10 PM

## 2017-06-30 NOTE — Progress Notes (Signed)
CSW acknowledged consult and complete chart review.  MOB received adequate PNC in [place].    Please contact the Clinical Social Worker if needs arise, by [facility], or if MOB scores greater than 9/yes to question 10 on Edinburgh Postpartum Depression Screen.  Blaine HamperAngel Boyd-Gilyard, MSW, LCSW Clinical Social Work [phone]

## 2017-06-30 NOTE — Progress Notes (Signed)
Post Partum Day 1 Subjective: no complaints, up ad lib, voiding, tolerating PO and + flatus  Objective: Blood pressure 111/67, pulse 77, temperature 98 F (36.7 C), temperature source Oral, resp. rate 18, height 5' 4.96" (1.65 m), weight 222 lb 6 oz (100.9 kg), last menstrual period 09/28/2016, SpO2 96 %, unknown if currently breastfeeding.  Physical Exam:  General: alert, cooperative and no distress Lochia: appropriate Uterine Fundus: firm Incision: n/a DVT Evaluation: No evidence of DVT seen on physical exam.  Recent Labs    06/29/17 0100  HGB 13.6  HCT 40.5    Assessment/Plan: Plan for discharge tomorrow, Breastfeeding and Contraception interval BTL   LOS: 1 day   Rolm BookbinderCaroline M Una Yeomans CNM 06/30/2017, 5:42 AM

## 2017-07-01 MED ORDER — IBUPROFEN 600 MG PO TABS: 600.0000 mg | ORAL_TABLET | Freq: Four times a day (QID) | ORAL | 0 refills | Status: AC

## 2017-07-01 MED ORDER — SENNOSIDES-DOCUSATE SODIUM 8.6-50 MG PO TABS: 2.0000 | ORAL_TABLET | Freq: Every evening | ORAL | 0 refills | Status: AC | PRN

## 2017-07-01 NOTE — Discharge Summary (Signed)
OB Discharge Summary     Patient Name: Amanda Dunlap DOB: 03/10/1981 MRN: [id number]  Date of admission: 06/29/2017 Delivering MD: Raelyn MoraAWSON, ROLITTA   Date of discharge: 07/01/2017  Admitting diagnosis: 38 wk induction Intrauterine pregnancy: 7697w1d     Secondary diagnosis:  Principal Problem:   Postpartum care following vaginal delivery Active Problems:   Spontaneous vaginal delivery   Unwanted fertility   Encounter for planned induction of labor  Additional problems: A2GDM, AMA     Discharge diagnosis: Term Pregnancy Delivered and GDM A2                                                                                                Post partum procedures:None  Augmentation: Pitocin  Complications: None  Hospital course:  Induction of Labor With Vaginal Delivery   36 y.o. yo [id number] at 2797w1d was admitted to the hospital 06/29/2017 for induction of labor.  Indication for induction: A2 DM.  Patient had an uncomplicated labor course as follows: Membrane Rupture Time/Date: 7:30 PM ,06/28/2017   Intrapartum Procedures: Episiotomy: None [1]                                         Lacerations:  2nd degree [3];Vaginal [6]  Patient had delivery of a Viable infant.  Information for the patient's newborn:  Evalina Fieldlshiek, Girl Huntley DecSara [[id number]][[id number]]  Delivery Method: Vaginal, Spontaneous(Filed from Delivery Summary)   06/29/2017  Details of delivery can be found in separate delivery note.  Patient had a routine postpartum course. Patient is discharged home 07/01/17.  Physical exam  Vitals:   06/30/17 0511 06/30/17 0526 06/30/17 1718 07/01/17 0518  BP: 111/67  111/73 (!) 125/92  Pulse: 77  70 70  Resp: 18  20 20   Temp: 98 F (36.7 C)  97.7 F (36.5 C) 99 F (37.2 C)  TempSrc: Oral  Oral Oral  SpO2: 96%  100%   Weight:  100.9 kg (222 lb 6 oz)  100.5 kg (221 lb 8 oz)  Height:       General: alert, cooperative and no distress Lochia: appropriate Uterine Fundus: firm Incision: N/A DVT  Evaluation: No evidence of DVT seen on physical exam. Labs: Lab Results  Component Value Date   WBC 5.8 06/29/2017   HGB 13.6 06/29/2017   HCT 40.5 06/29/2017   MCV 83.9 06/29/2017   PLT 206 06/29/2017   No flowsheet data found.  Discharge instruction: per After Visit Summary and "Baby and Me Booklet".  After visit meds:  Allergies as of 07/01/2017   No Known Allergies     Medication List    STOP taking these medications   metFORMIN 500 MG tablet Commonly known as:  GLUCOPHAGE     TAKE these medications   ibuprofen 600 MG tablet Commonly known as:  ADVIL,MOTRIN Take 1 tablet (600 mg total) by mouth every 6 (six) hours.   prenatal vitamin w/FE, FA 29-1 MG Chew chewable tablet Chew 1 tablet by mouth at bedtime.   senna-docusate  8.6-50 MG tablet Commonly known as:  Senokot-S Take 2 tablets by mouth at bedtime as needed for mild constipation.       Diet: routine diet  Activity: Advance as tolerated. Pelvic rest for 6 weeks.   Outpatient follow up:4 weeks Follow up Appt: Future Appointments  Date Time Provider Department Center  07/18/2017  3:15 PM Pincus LargePhelps, Jazma Y, DO [facility]   Follow up Visit:No Follow-up on file.  Postpartum contraception: Tubal Ligation (interval - self pay)  Newborn Data: Live born female  Birth Weight: 7 lb 0.9 oz (3200 g) APGAR: 9, 9  Newborn Delivery   Birth date/time:  06/29/2017 07:07:00 Delivery type:  Vaginal, Spontaneous     Baby Feeding: Breast Disposition:home with mother   07/01/2017 Caryl AdaJazma Phelps, DO

## 2017-07-01 NOTE — Lactation Note (Signed)
This note was copied from a baby's chart. Lactation Consultation Note  Patient Name: Amanda Mauricia AreaSara [place] QQVZD'GToday's Date: 07/01/2017 Reason for consult: Follow-up assessment;Infant weight loss  Visited with 7th time experienced breastfeeding Mom, baby 3451 hrs old.  Baby has been breastfeeding often, and has had 3 bottles of formula <30 ml.  No stool yet.  Observed baby easily latching while side lying.  Showed Mom how to do breast compression.  Swallows identified. Basics reviewed.   Baby comes off and is acting hungry.  Mom concerned, and choosing to give formula.    DEBP set up at bedside, assisted with pumping one breast while baby nursing on other.  Drops of colostrum fed back to baby.    Assisted Mom to understand importance of double pumping after breastfeeding.  Mom choosing to offer formula also.   Feeding Feeding Type: Breast Fed Length of feed: 15 min  LATCH Score Latch: Grasps breast easily, tongue down, lips flanged, rhythmical sucking.  Audible Swallowing: A few with stimulation  Type of Nipple: Everted at rest and after stimulation  Comfort (Breast/Nipple): Soft / non-tender  Hold (Positioning): Assistance needed to correctly position infant at breast and maintain latch.  LATCH Score: 8  Interventions Interventions: Breast feeding basics reviewed;Assisted with latch;Skin to skin;Breast massage;Hand express;Breast compression;Adjust position;Support pillows;Position options;DEBP  Lactation Tools Discussed/Used Tools: Pump Breast pump type: Double-Electric Breast Pump Pump Review: Setup, frequency, and cleaning Initiated by:: karen kane rn [id number] Date initiated:: 07/01/17   Consult Status Consult Status: Follow-up Date: 07/02/17 Follow-up type: In-patient    Judee ClaraSmith, Amanda Dunlap 07/01/2017, 10:56 AM

## 2017-07-01 NOTE — Discharge Instructions (Signed)

## 2017-07-01 NOTE — Progress Notes (Signed)
Patient ID: Mauricia AreaSara Majewski, female   DOB: 06/24/1981, 36 y.o.   MRN: [id number] Pt had questions regarding family planning options.  [place] be leaving to go back to [place] on [place]th.  Unable to pay for interval BTL.  Desires pricing information on Nexplanon, Mirena, and Paragard.  Message routed to office staff to call patient with this information.

## 2017-07-09 ENCOUNTER — Ambulatory Visit: Payer: Self-pay

## 2017-07-18 ENCOUNTER — Encounter: Payer: Self-pay | Admitting: Obstetrics and Gynecology

## 2017-07-18 ENCOUNTER — Ambulatory Visit (INDEPENDENT_AMBULATORY_CARE_PROVIDER_SITE_OTHER): Payer: Self-pay | Admitting: Obstetrics and Gynecology

## 2017-07-18 VITALS — BP 110/75 | HR 75 | Ht 65.0 in | Wt 218.6 lb

## 2017-07-18 DIAGNOSIS — Z30017 Encounter for initial prescription of implantable subdermal contraceptive: Secondary | ICD-10-CM

## 2017-07-18 DIAGNOSIS — Z131 Encounter for screening for diabetes mellitus: Secondary | ICD-10-CM

## 2017-07-18 DIAGNOSIS — Z3202 Encounter for pregnancy test, result negative: Secondary | ICD-10-CM

## 2017-07-18 DIAGNOSIS — Z3049 Encounter for surveillance of other contraceptives: Secondary | ICD-10-CM

## 2017-07-18 LAB — GLUCOSE, CAPILLARY: GLUCOSE-CAPILLARY: 100 mg/dL — AB (ref 65–99)

## 2017-07-18 MED ORDER — ETONOGESTREL 68 MG ~~LOC~~ IMPL
68.0000 mg | DRUG_IMPLANT | Freq: Once | SUBCUTANEOUS | Status: AC
  Administered 2017-07-18: 68 mg via SUBCUTANEOUS

## 2017-07-18 NOTE — Progress Notes (Signed)
Subjective:     Amanda Dunlap is a 37 y.o. female who presents for a postpartum visit. She is 4783w5d postpartum following a spontaneous vaginal delivery. I have fully reviewed the prenatal and intrapartum course. The delivery was at 7121w1d gestational weeks. Outcome: spontaneous vaginal delivery.  Anesthesia: epidural. Postpartum course has been uncomplicated. Baby's course has been uncomplicated. Baby is feeding by bottle - Similac Alimentum.Bleeding thin lochia. Bowel function is normal, she takes a stool softener. Bladder function is normal. Patient is not sexually active. Contraception method is none. She would like to have tubal ligation but is open to considering other options. She states she has gotten pregnant while having an IUD in place and when taking OCPs. Postpartum depression screening: negative. Patient has had back pain with her previous pregnancies and says recently it is much worse. She states her tailbone is very painful when she sits for more than 5 minutes.     Review of Systems Pertinent items are noted in HPI.   Objective:    BP 110/75   Pulse 75   Ht 5\' 5"  (1.651 m)   Wt 218 lb 9.6 oz (99.2 kg)   LMP 09/28/2016 (Exact Date)   BMI 36.38 kg/m     General: Alert, cooperative and no distress Lungs: No respiratory distress or increased work of breathing Heart: Normal heart rate noted Abdomen: Abdomen soft, non-tender. Uterus is small, firm  Vulva: NEFG, bloody vaginal discharge present Vagina: No lesions visualized on speculum exam.  Cervix: Bloody discharge seen at cervical os Adnexa: No masses felt on bimanual exam Back: No masses or lesions seen on patient's back or in gluteal cleft  Assessment:     Normal postpartum exam. Patient is in need of birth control method today given history of pregnancies while on other methods of birth control. Grade 2 intrapartum laceration is well-healed. Pap smear not due at today's visit.   Plan:    1. Contraception: Nexplanon  inserted today as she has failed methods of IUD and OCPs. She will return once she is no longer breastfeeding if she wishes to begin OCPs as well.  2. Back exam was unremarkable. Patient advised to take NSAIDs as needed for pain relief.  3. Follow up in three weeks for glucose test.

## 2017-07-18 NOTE — Progress Notes (Deleted)
Subjective:     Amanda Dunlap is a 37 y.o. female who presents for a postpartum visit. She is 3 weeks postpartum following a spontaneous vaginal delivery. I have fully reviewed the prenatal and intrapartum course. The delivery was at *38** gestational weeks. Outcome: {delivery outcome:32078}. Anesthesia: epidural. Postpartum course has been ***. Baby's course has been ***. Baby is feeding by both breast and bottle - Similac Advance. Bleeding no bleeding. Bowel function is abnormal: constipation. Bladder function is normal. Patient is not sexually active. Contraception method is none. Postpartum depression screening: {neg default:13464::"negative"}.  {Common ambulatory SmartLinks:19316}  Review of Systems {ros; complete:30496}   Objective:    LMP 09/28/2016 (Exact Date)   General:  {gen appearance:16600}   Breasts:  {breast exam:1202::"inspection negative, no nipple discharge or bleeding, no masses or nodularity palpable"}  Lungs: {lung exam:16931}  Heart:  {heart exam:5510}  Abdomen: {abdomen exam:16834}   Vulva:  {labia exam:12198}  Vagina: {vagina exam:12200}  Cervix:  {cervix exam:14595}  Corpus: {uterus exam:12215}  Adnexa:  {adnexa exam:12223}  Rectal Exam: {rectal/vaginal exam:12274}        Assessment:    *** postpartum exam. Pap smear {done:10129} at today's visit.   Plan:    1. Contraception: {method:5051} 2. *** 3. Follow up in: {1-10:13787} {time; units:19136} or as needed.

## 2017-07-21 LAB — POCT PREGNANCY, URINE: Preg Test, Ur: NEGATIVE

## 2017-07-22 ENCOUNTER — Encounter: Payer: Self-pay | Admitting: *Deleted

## 2017-08-01 ENCOUNTER — Other Ambulatory Visit: Payer: Self-pay | Admitting: General Practice

## 2017-08-01 ENCOUNTER — Other Ambulatory Visit: Payer: Self-pay

## 2017-08-01 DIAGNOSIS — O24419 Gestational diabetes mellitus in pregnancy, unspecified control: Secondary | ICD-10-CM

## 2017-08-01 DIAGNOSIS — O0990 Supervision of high risk pregnancy, unspecified, unspecified trimester: Secondary | ICD-10-CM

## 2018-03-03 IMAGING — US US MFM OB DETAIL+14 WK
1 series · 13 of 28 positions shown · non-contrast
Comparison: none

[Series 1: us mfm ob detail+14 wk · 13 of 50 slices shown]
[im 2/50]
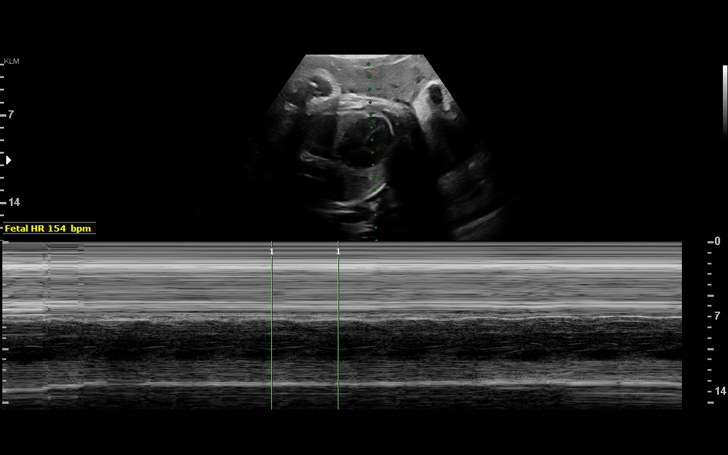
[im 6/50]
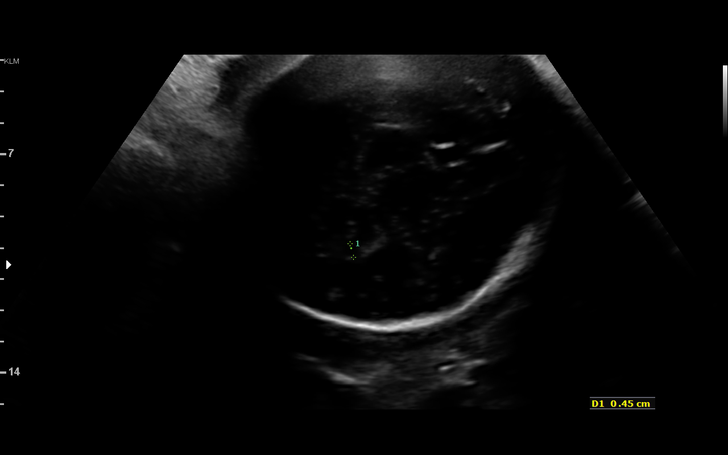
[im 10/50]
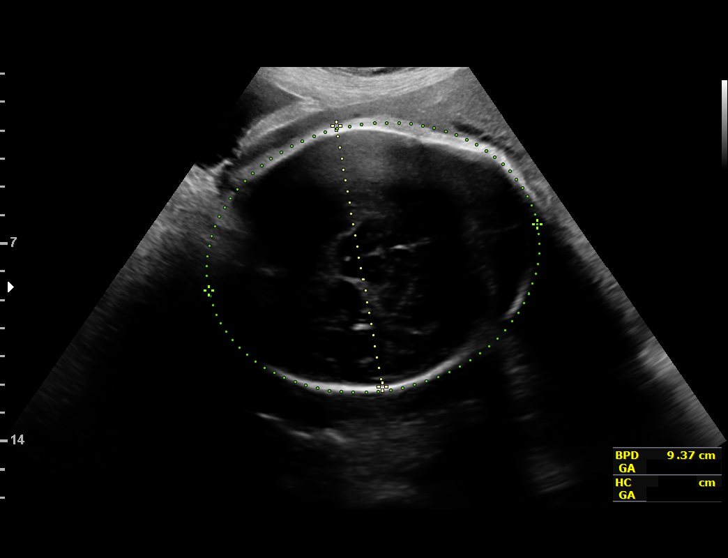
[im 13/50]
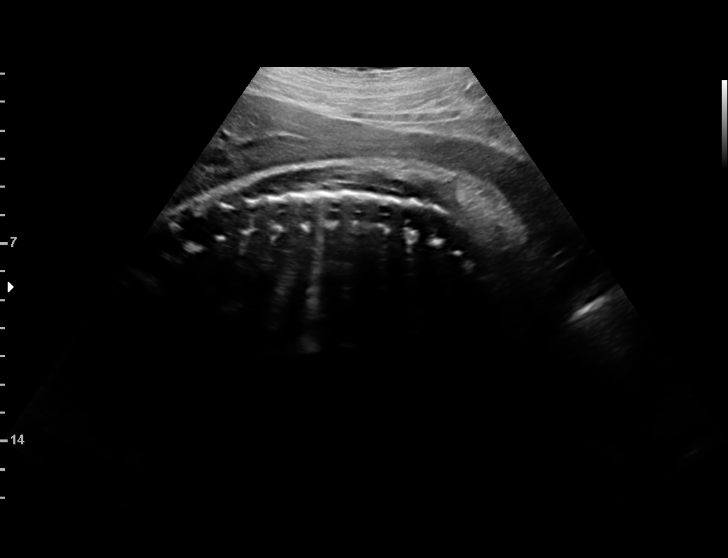
[im 17/50]
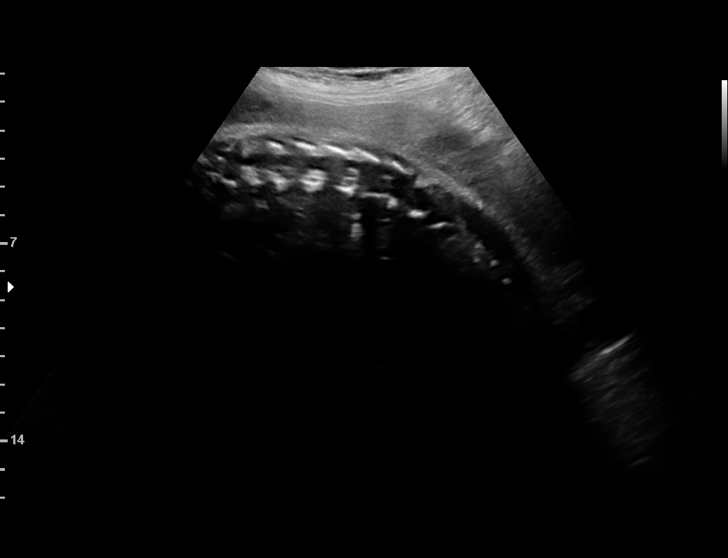
[im 20/50]
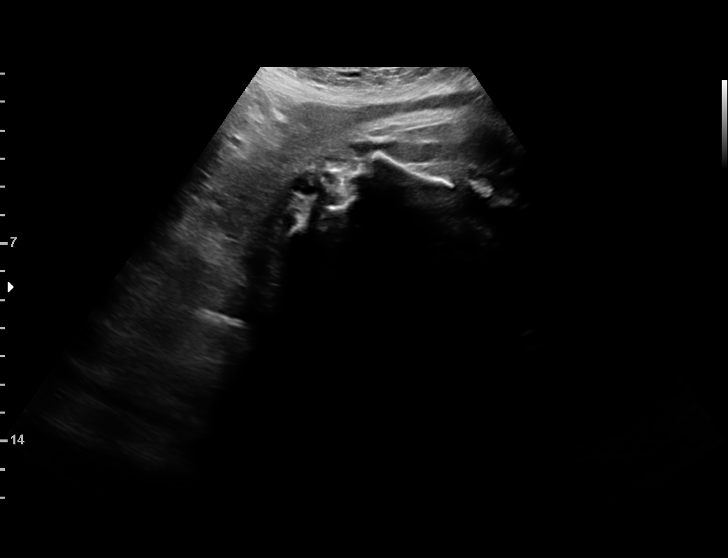
[im 26/50]
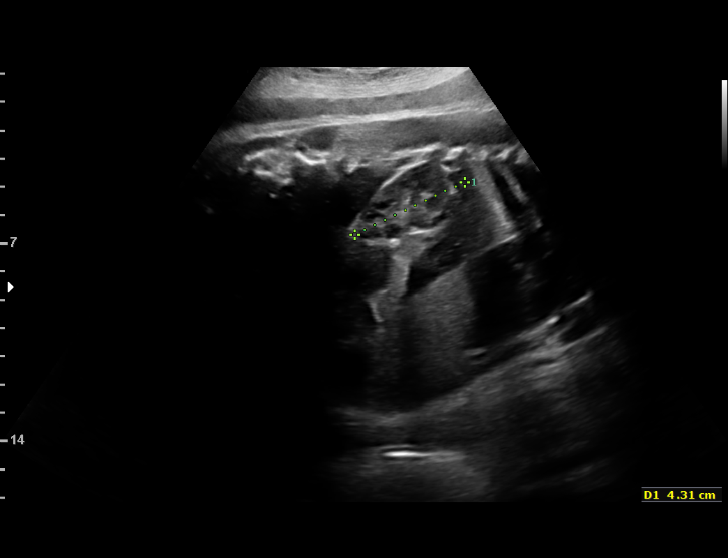
[im 30/50]
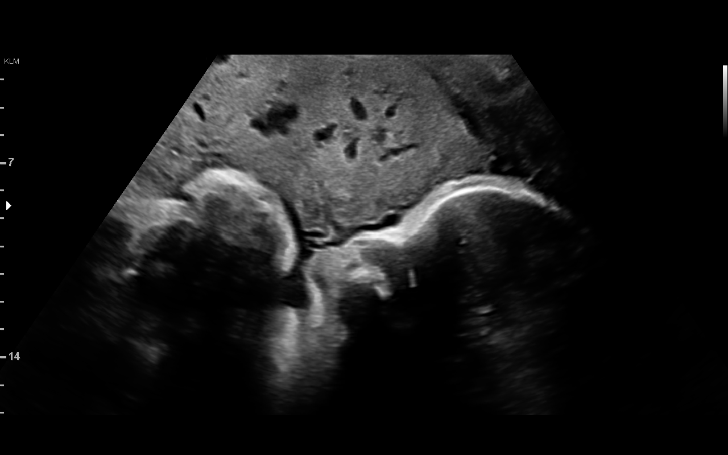
[im 33/50]
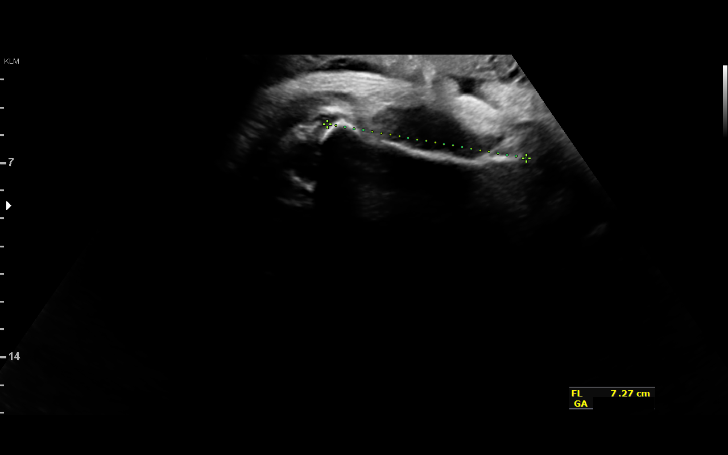
[im 37/50]
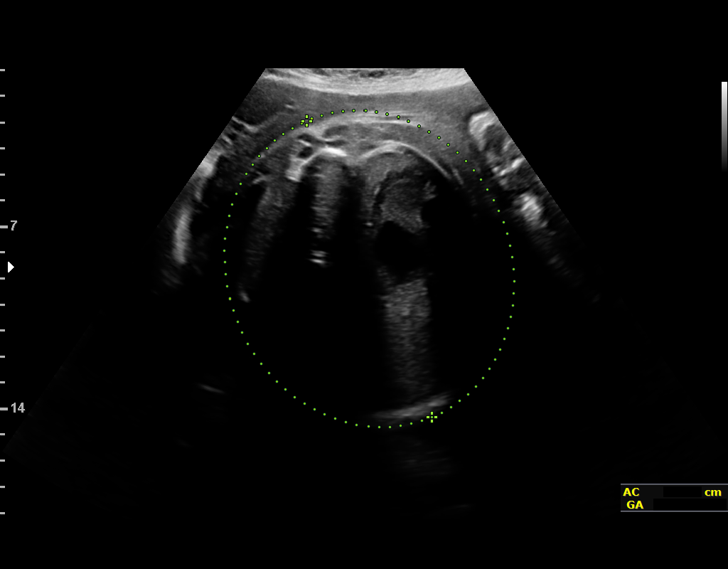
[im 40/50]
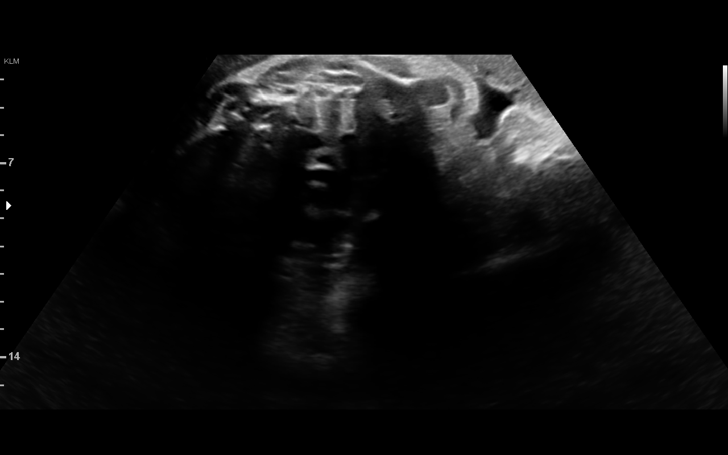
[im 44/50]
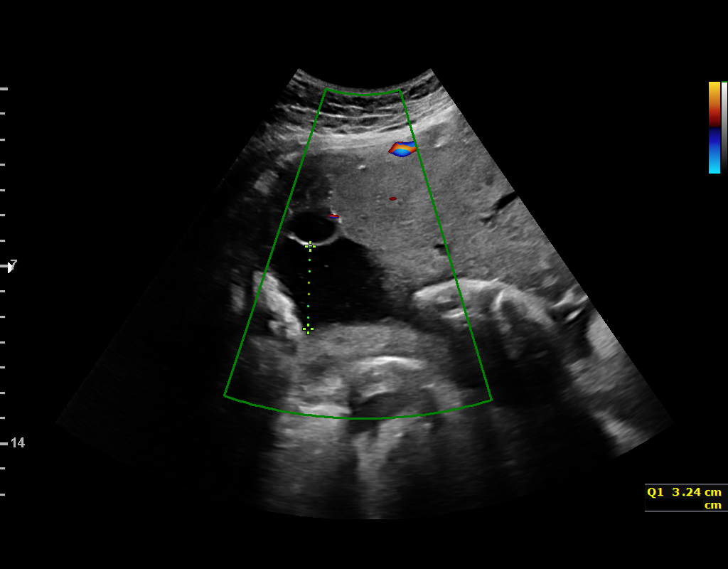
[im 48/50]
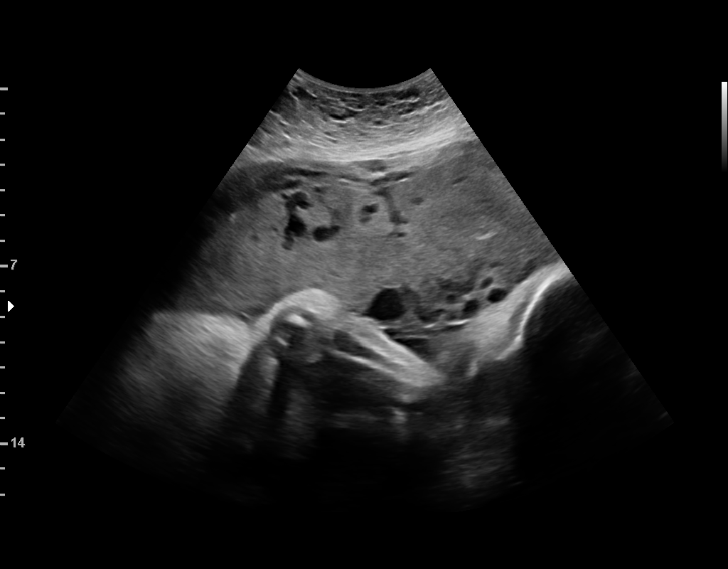

[13 of 28 positions shown; findings below may reference images not displayed]

POLIN NP

1  LMN WAH           [id number]      [id number]     [id number]
Indications

38 weeks gestation of pregnancy
Late to prenatal care, third trimester (lapse
in care, moved here from [place])
Encounter for fetal anatomic survey
Gestational diabetes in pregnancy,
controlled by oral hypoglycemic drugs
Hypothyroid
Advanced maternal age multigravida 33+,
third trimester
OB History

Gravidity:    7         Term:   6        Prem:   0        SAB:   0
TOP:          0       Ectopic:  0        Living: 6
Fetal Evaluation

Num Of Fetuses:     1
Fetal Heart         154
Rate(bpm):
Cardiac Activity:   Observed
Presentation:       Cephalic
Placenta:           Anterior, above cervical os
P. Cord Insertion:  Not well visualized

Amniotic Fluid
AFI FV:      Subjectively low-normal

AFI Sum(cm)     %Tile       Largest Pocket(cm)
7.2             5

RUQ(cm)       RLQ(cm)       LUQ(cm)
3.2
Biometry

BPD:      94.3  mm     G. Age:  38w 3d         74  %    CI:        78.31   %    70 - 86
FL/HC:      21.5   %    20.6 -
HC:      337.1  mm     G. Age:  38w 4d         37  %    HC/AC:      0.92        0.87 -
AC:      365.7  mm     G. Age:  40w 3d       > 97  %    FL/BPD:     77.0   %    71 - 87
FL:       72.6  mm     G. Age:  37w 1d         22  %    FL/AC:      19.9   %    20 - 24

Est. FW:    3828  gm      8 lb 4 oz   > 90  %
Gestational Age

LMP:           38w 4d        Date:  09/28/16                 EDD:   07/05/17
U/S Today:     38w 5d                                        EDD:   07/04/17
Best:          38w 4d     Det. By:  LMP  (09/28/16)          EDD:   07/05/17
Anatomy

Cranium:               Appears normal         Aortic Arch:            Appears normal
Cavum:                 Appears normal         Ductal Arch:            Not well visualized
Ventricles:            Appears normal         Diaphragm:              Appears normal
Choroid Plexus:        Not well visualized    Stomach:                Appears normal, left
sided
Cerebellum:            Not well visualized    Abdomen:                Appears normal
Posterior Fossa:       Not well visualized    Abdominal Wall:         Not well visualized
Nuchal Fold:           Not applicable (>20    Cord Vessels:           Not well visualized
wks GA)
Face:                  Orbits nl; profile not Kidneys:                Appear normal
well visualized
Lips:                  Appears normal         Bladder:                Appears normal
Thoracic:              Appears normal         Spine:                  Appears normal
Heart:                 Appears normal         Upper Extremities:      Not well visualized
(4CH, axis, and situs
RVOT:                  Not well visualized    Lower Extremities:      Not well visualized
LVOT:                  Appears normal

Other:  Fetus appears to be a female. Technically difficult due to advanced
GA and fetal position.
Cervix Uterus Adnexa

Cervix
Not visualized (advanced GA >51wks)
Impression

Singleton intrauterine pregnancy at 38+4 weeks
Review of the anatomy shows no sonographic markers for
aneuploidy or structural anomalies
However, almost all of the anatomic survey should be
considered suboptimal secondary to the late EGA
Amniotic fluid volume is normal
Estimated fetal weight is 3756g which is growth at >90th
percentile
Recommendations

Follow-up ultrasounds as clinically indicated.
